# Patient Record
Sex: Female | Born: 1995
Health system: Southern US, Community
[De-identification: ages and names within clinical notes are randomized; demographics above are authoritative.]

## PROBLEM LIST (undated history)

## (undated) ENCOUNTER — Inpatient Hospital Stay (HOSPITAL_COMMUNITY): Payer: Medicaid Other

## (undated) DIAGNOSIS — R531 Weakness: Secondary | ICD-10-CM

## (undated) DIAGNOSIS — I4891 Unspecified atrial fibrillation: Secondary | ICD-10-CM

## (undated) DIAGNOSIS — R110 Nausea: Secondary | ICD-10-CM

## (undated) DIAGNOSIS — I10 Essential (primary) hypertension: Secondary | ICD-10-CM

## (undated) DIAGNOSIS — R519 Headache, unspecified: Secondary | ICD-10-CM

## (undated) DIAGNOSIS — F419 Anxiety disorder, unspecified: Secondary | ICD-10-CM

## (undated) HISTORY — DX: Nausea: R11.0

## (undated) HISTORY — DX: Anxiety disorder, unspecified: F41.9

## (undated) HISTORY — DX: Weakness: R53.1

## (undated) HISTORY — DX: Headache, unspecified: R51.9

---
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
## 2013-03-25 ENCOUNTER — Encounter (HOSPITAL_COMMUNITY): Payer: Self-pay | Admitting: Emergency Medicine

## 2013-03-25 ENCOUNTER — Emergency Department (HOSPITAL_COMMUNITY)
Admission: EM | Admit: 2013-03-25 | Discharge: 2013-03-25 | Disposition: A | Discharge status: Home / Self Care | Payer: Medicaid Other | Attending: Emergency Medicine | Admitting: Emergency Medicine

## 2013-03-25 DIAGNOSIS — M542 Cervicalgia: Secondary | ICD-10-CM | POA: Insufficient documentation

## 2013-03-25 DIAGNOSIS — Z79899 Other long term (current) drug therapy: Secondary | ICD-10-CM | POA: Insufficient documentation

## 2013-03-25 DIAGNOSIS — Z3202 Encounter for pregnancy test, result negative: Secondary | ICD-10-CM | POA: Insufficient documentation

## 2013-03-25 DIAGNOSIS — R110 Nausea: Secondary | ICD-10-CM | POA: Insufficient documentation

## 2013-03-25 DIAGNOSIS — I10 Essential (primary) hypertension: Secondary | ICD-10-CM | POA: Insufficient documentation

## 2013-03-25 DIAGNOSIS — R51 Headache: Secondary | ICD-10-CM | POA: Insufficient documentation

## 2013-03-25 LAB — URINALYSIS, ROUTINE W REFLEX MICROSCOPIC
Bilirubin Urine: NEGATIVE
Hgb urine dipstick: NEGATIVE
Specific Gravity, Urine: 1.03 (ref 1.005–1.030)
pH: 5.5 (ref 5.0–8.0)

## 2013-03-25 MED ORDER — DIPHENHYDRAMINE HCL 50 MG/ML IJ SOLN
25.0000 mg | Freq: Once | INTRAMUSCULAR | Status: AC
  Administered 2013-03-25: 25 mg via INTRAVENOUS
  Filled 2013-03-25: qty 1

## 2013-03-25 MED ORDER — METOCLOPRAMIDE HCL 5 MG/ML IJ SOLN
10.0000 mg | Freq: Once | INTRAMUSCULAR | Status: AC
  Administered 2013-03-25: 10 mg via INTRAVENOUS
  Filled 2013-03-25: qty 2

## 2013-03-25 MED ORDER — HYDROMORPHONE HCL PF 1 MG/ML IJ SOLN
1.0000 mg | Freq: Once | INTRAMUSCULAR | Status: AC
  Administered 2013-03-25: 1 mg via INTRAVENOUS
  Filled 2013-03-25: qty 1

## 2013-03-25 MED ORDER — SODIUM CHLORIDE 0.9 % IV BOLUS (SEPSIS)
1000.0000 mL | Freq: Once | INTRAVENOUS | Status: AC
  Administered 2013-03-25: 1000 mL via INTRAVENOUS

## 2013-03-25 NOTE — ED Provider Notes (Signed)
  Medical screening examination/treatment/procedure(s) were performed by non-physician practitioner and as supervising physician I was immediately available for consultation/collaboration.    Gerhard Munch, MD 03/25/13 2358

## 2013-03-25 NOTE — ED Notes (Signed)
Pt c/o headache x 4 days w/ sensitivity to light and sound.  States she feels nauseated but has not vomited.  States today she felt SOB.  NAD.

## 2013-03-25 NOTE — ED Provider Notes (Signed)
CSN: [id number]     Arrival date & time 03/25/13  1727 History   First MD Initiated Contact with Patient 03/25/13 1859     Chief Complaint  Patient presents with  . Headache   (Consider location/radiation/quality/duration/timing/severity/associated sxs/prior Treatment) HPI  17 year old morbidly obese female with history of hypertension, presents complaining of headache. Patient states she has recurrent headache. Would have headache just about a few times a week. The past 4 days she has had persistent headache. Headache is described as a throbbing sensation to her right forehead, nonradiating, worsening with light and sound. Endorse nausea without vomiting. Headache usually improves with taking Advil however it has not helped this time. She endorses neck discomfort low back discomfort as well for the past 4 days. She denies any fever, chills, change in vision, runny nose, sneezing, cough, chest pain, shortness of breath associated endorse some mild respiratory. No abdominal pain, back pain, dysuria, or rash. Does not recall her last menstrual period since patient is on Depo.  This is her usual headaches, just worse. She cannot recall anything that precipitated this symptom.    Past Medical History  Diagnosis Date  . Hypertension   . Nausea   . Weakness    Past Surgical History  Procedure Laterality Date  . Ankle surgery  04/2005  . Nasal sinus surgery  08/2010  . Laparoscopic appendectomy  09/16/2011    Procedure: APPENDECTOMY LAPAROSCOPIC;  Surgeon: Velora Heckler, MD;  Location: [facility];  Service: General;  Laterality: N/A;  . Appendectomy  09/16/11   Family History  Problem Relation Age of Onset  . Cancer Paternal Grandmother     ovarian   History  Substance Use Topics  . Smoking status: Never Smoker   . Smokeless tobacco: Never Used  . Alcohol Use: No   OB History   Grav Para Term Preterm Abortions TAB SAB Ect Mult Living                 Review of Systems  All other systems  reviewed and are negative.    Allergies  Review of patient's allergies indicates no known allergies.  Home Medications   Current Outpatient Rx  Name  Route  Sig  Dispense  Refill  . albuterol (PROVENTIL HFA;VENTOLIN HFA) 108 (90 BASE) MCG/ACT inhaler   Inhalation   Inhale 2 puffs into the lungs every 6 (six) hours as needed. For shortness of breath         . hydrochlorothiazide 25 MG tablet   Oral   Take 25 mg by mouth daily.           Marland Kitchen ibuprofen (ADVIL,MOTRIN) 200 MG tablet   Oral   Take 200 mg by mouth every 6 (six) hours as needed for pain.         . medroxyPROGESTERone (DEPO-PROVERA) 150 MG/ML injection   Intramuscular   Inject 150 mg into the muscle every 3 (three) months.            BP 143/91  Pulse 113  Temp(Src) 98.9 F (37.2 C) (Oral)  Resp 16  SpO2 100% Physical Exam  Nursing note and vitals reviewed. Constitutional: She appears well-developed and well-nourished. No distress.  Awake, alert, nontoxic appearance  HENT:  Head: Atraumatic.  Right Ear: External ear normal.  Left Ear: External ear normal.  Mouth/Throat: Oropharynx is clear and moist.  Eyes: Conjunctivae and EOM are normal. Pupils are equal, round, and reactive to light. Right eye exhibits no discharge. Left eye exhibits  no discharge.  Neck: Neck supple.  No nuchal rigidity  Cardiovascular: Normal rate and regular rhythm.   Pulmonary/Chest: Effort normal. No respiratory distress. She exhibits no tenderness.  Abdominal: Soft. There is no tenderness. There is no rebound.  Genitourinary:  No CVA tenderness  Musculoskeletal: She exhibits no tenderness.  ROM appears intact, no obvious focal weakness  Neurological:  Mental status and motor strength appears intact  Skin: No rash noted.  Psychiatric: She has a normal mood and affect.    ED Course  Procedures (including critical care time)  Headache similar to previous, no fever, neck stiffness, neuro findings or new symptoms to  suggest more serious etiology.  I don't think SAH, ICH, meningitis, encephalitis, mass at this time.  No recent trauma.  I don't feel imaging necessary at this time.  Plan to control symptoms.  Since pt endorse lower back pain, will check UA to r/o UTI.  check pregnancy test   9:50 PM Patient reports she feels much better and related to be discharged. Patient is stable for discharge. Recommend follow up with her primary care Dr. for further care. Return precautions discussed.  Labs Review Labs Reviewed  URINALYSIS, ROUTINE W REFLEX MICROSCOPIC - Abnormal; Notable for the following:    APPearance CLOUDY (*)    All other components within normal limits  POCT PREGNANCY, URINE   Imaging Review No results found.  MDM   1. Headache in front of head    BP 133/80  Pulse 105  Temp(Src) 98.9 F (37.2 C) (Oral)  Resp 20  SpO2 100%     Fayrene Helper, PA-C 03/25/13 2153

## 2013-04-26 ENCOUNTER — Encounter (HOSPITAL_COMMUNITY): Payer: Self-pay | Admitting: Emergency Medicine

## 2013-04-26 ENCOUNTER — Emergency Department (HOSPITAL_COMMUNITY)
Admission: EM | Admit: 2013-04-26 | Discharge: 2013-04-27 | Disposition: A | Discharge status: Home / Self Care | Payer: Medicaid Other | Attending: Emergency Medicine | Admitting: Emergency Medicine

## 2013-04-26 ENCOUNTER — Emergency Department (HOSPITAL_COMMUNITY): Payer: Medicaid Other

## 2013-04-26 DIAGNOSIS — Z79899 Other long term (current) drug therapy: Secondary | ICD-10-CM | POA: Insufficient documentation

## 2013-04-26 DIAGNOSIS — R000 Tachycardia, unspecified: Secondary | ICD-10-CM | POA: Insufficient documentation

## 2013-04-26 DIAGNOSIS — I10 Essential (primary) hypertension: Secondary | ICD-10-CM | POA: Insufficient documentation

## 2013-04-26 DIAGNOSIS — H9209 Otalgia, unspecified ear: Secondary | ICD-10-CM | POA: Insufficient documentation

## 2013-04-26 DIAGNOSIS — J069 Acute upper respiratory infection, unspecified: Secondary | ICD-10-CM | POA: Insufficient documentation

## 2013-04-26 DIAGNOSIS — Z3202 Encounter for pregnancy test, result negative: Secondary | ICD-10-CM | POA: Insufficient documentation

## 2013-04-26 DIAGNOSIS — R509 Fever, unspecified: Secondary | ICD-10-CM | POA: Insufficient documentation

## 2013-04-26 DIAGNOSIS — R51 Headache: Secondary | ICD-10-CM | POA: Insufficient documentation

## 2013-04-26 LAB — BASIC METABOLIC PANEL
BUN: 8 mg/dL (ref 6–23)
CO2: 22 mEq/L (ref 19–32)
Calcium: 9.4 mg/dL (ref 8.4–10.5)
Chloride: 101 mEq/L (ref 96–112)
GFR calc Af Amer: >90 mL/min (ref >90)
GFR calc non Af Amer: >90 mL/min (ref >90)
Glucose, Bld: 97 mg/dL (ref 70–99)
Potassium: 3.5 mEq/L (ref 3.5–5.1)

## 2013-04-26 LAB — URINALYSIS, ROUTINE W REFLEX MICROSCOPIC
Bilirubin Urine: NEGATIVE
Protein, ur: NEGATIVE mg/dL
Urobilinogen, UA: 0.2 mg/dL (ref 0.0–1.0)

## 2013-04-26 LAB — CBC WITH DIFFERENTIAL/PLATELET
Eosinophils Relative: 2 % (ref 0–5)
HCT: 44.4 % (ref 36.0–46.0)
Lymphocytes Relative: 40 % (ref 12–46)
Lymphs Abs: 5.9 10*3/uL — ABNORMAL HIGH (ref 0.7–4.0)
MCV: 85.1 fL (ref 78.0–100.0)
Monocytes Relative: 7 % (ref 3–12)
Neutro Abs: 7.4 10*3/uL (ref 1.7–7.7)
Platelets: 275 10*3/uL (ref 150–400)
RBC: 5.22 MIL/uL — ABNORMAL HIGH (ref 3.87–5.11)
WBC: 14.7 10*3/uL — ABNORMAL HIGH (ref 4.0–10.5)

## 2013-04-26 LAB — URINE MICROSCOPIC-ADD ON

## 2013-04-26 MED ORDER — IBUPROFEN 800 MG PO TABS
800.0000 mg | ORAL_TABLET | Freq: Once | ORAL | Status: AC
  Administered 2013-04-27: 800 mg via ORAL
  Filled 2013-04-26: qty 1

## 2013-04-26 MED ORDER — SODIUM CHLORIDE 0.9 % IV BOLUS (SEPSIS)
1000.0000 mL | Freq: Once | INTRAVENOUS | Status: AC
  Administered 2013-04-26: 1000 mL via INTRAVENOUS

## 2013-04-26 NOTE — ED Provider Notes (Addendum)
CSN: [id number]     Arrival date & time 04/26/13  2134 History   First MD Initiated Contact with Patient 04/26/13 2157     Chief Complaint  Patient presents with  . Sore Throat  . Cough  . Nasal Congestion   (Consider location/radiation/quality/duration/timing/severity/associated sxs/prior Treatment) Patient is a 17 y.o. female presenting with URI.  URI Presenting symptoms: congestion, cough, ear pain, facial pain, fever (intermittent, last a few days ago) and sore throat   Severity:  Moderate Onset quality:  Sudden Duration:  10 days Timing:  Constant Progression:  Worsening Chronicity:  New Relieved by:  Nothing Worsened by:  Nothing tried Ineffective treatments: tylenol cold and flu. Associated symptoms comment:  Shortness of breath   Past Medical History  Diagnosis Date  . Hypertension   . Nausea   . Weakness    Past Surgical History  Procedure Laterality Date  . Ankle surgery  04/2005  . Nasal sinus surgery  08/2010  . Laparoscopic appendectomy  09/16/2011    Procedure: APPENDECTOMY LAPAROSCOPIC;  Surgeon: Velora Heckler, MD;  Location: [facility];  Service: General;  Laterality: N/A;  . Appendectomy  09/16/11   Family History  Problem Relation Age of Onset  . Cancer Paternal Grandmother     ovarian   History  Substance Use Topics  . Smoking status: Never Smoker   . Smokeless tobacco: Never Used  . Alcohol Use: No   OB History   Grav Para Term Preterm Abortions TAB SAB Ect Mult Living                 Review of Systems  Constitutional: Positive for fever (intermittent, last a few days ago).  HENT: Positive for congestion, ear pain and sore throat.   Respiratory: Positive for cough and shortness of breath.   Cardiovascular: Negative for chest pain.  Gastrointestinal: Negative for nausea, vomiting, abdominal pain and diarrhea.  All other systems reviewed and are negative.    Allergies  Review of patient's allergies indicates no known allergies.  Home  Medications   Current Outpatient Rx  Name  Route  Sig  Dispense  Refill  . dextromethorphan (DELSYM) 30 MG/5ML liquid   Oral   Take 60 mg by mouth 2 (two) times daily as needed for cough.         . hydrochlorothiazide 25 MG tablet   Oral   Take 25 mg by mouth daily.           . medroxyPROGESTERone (DEPO-PROVERA) 150 MG/ML injection   Intramuscular   Inject 150 mg into the muscle every 3 (three) months.           . nortriptyline (PAMELOR) 25 MG capsule   Oral   Take 25 mg by mouth at bedtime.         . Pseudoeph-CPM-DM-APAP (TYLENOL COLD PO)   Oral   Take 1 tablet by mouth 3 (three) times daily as needed (cold symptoms).          BP 159/99  Pulse 132  Temp(Src) 97.9 F (36.6 C) (Oral)  Resp 20  SpO2 100% Physical Exam  Nursing note and vitals reviewed. Constitutional: She is oriented to person, place, and time. She appears well-developed and well-nourished. No distress.  HENT:  Head: Normocephalic and atraumatic.  Mouth/Throat: Oropharynx is clear and moist. No tonsillar abscesses.  Evidence of post nasal drip  Eyes: Conjunctivae are normal. Pupils are equal, round, and reactive to light. No scleral icterus.  Neck: Neck supple.  Cardiovascular: Regular rhythm, normal heart sounds and intact distal pulses.  Tachycardia present.   No murmur heard. Pulmonary/Chest: Effort normal and breath sounds normal. No stridor. No respiratory distress. She has no rales.  Abdominal: Soft. Bowel sounds are normal. She exhibits no distension. There is no tenderness.  Musculoskeletal: Normal range of motion.  Neurological: She is alert and oriented to person, place, and time.  Skin: Skin is warm and dry. No rash noted.  Psychiatric: She has a normal mood and affect. Her behavior is normal.    ED Course  Procedures (including critical care time) Labs Review Labs Reviewed  CBC WITH DIFFERENTIAL - Abnormal; Notable for the following:    WBC 14.7 (*)    RBC 5.22 (*)     Hemoglobin 15.4 (*)    Lymphs Abs 5.9 (*)    All other components within normal limits  URINALYSIS, ROUTINE W REFLEX MICROSCOPIC - Abnormal; Notable for the following:    APPearance CLOUDY (*)    Hgb urine dipstick LARGE (*)    All other components within normal limits  URINE MICROSCOPIC-ADD ON - Abnormal; Notable for the following:    Squamous Epithelial / LPF MANY (*)    Bacteria, UA FEW (*)    All other components within normal limits  BASIC METABOLIC PANEL  D-DIMER, QUANTITATIVE  POCT PREGNANCY, URINE   Imaging Review Dg Chest 2 View  04/26/2013   CLINICAL DATA:  Cough.  EXAM: CHEST  2 VIEW  COMPARISON:  CHEST x-ray 04/27/2010  FINDINGS: Lung volumes are normal. No consolidative airspace disease. No pleural effusions. No pneumothorax. No pulmonary nodule or mass noted. Pulmonary vasculature and the cardiomediastinal silhouette are within normal limits.  IMPRESSION: 1.  No radiographic evidence of acute cardiopulmonary disease.   Electronically Signed   By: Trudie Reed M.D.   On: 04/26/2013 22:43  All radiology studies independently viewed by me.     EKG Interpretation     Ventricular Rate:  120 PR Interval:  153 QRS Duration: 81 QT Interval:  347 QTC Calculation: 490 R Axis:   77 Text Interpretation:  Sinus tachycardia Borderline prolonged QT interval Baseline wander in lead(s) III compared to prior, heart rate has increased.            MDM   1. URI (upper respiratory infection)    17 yo female with 1.5 weeks of worsening sore throat and URI symptoms.  Tachycardic on arrival, but in no distress.  Complained of cough and shortness of breath.  Labs show leukocytosis but otherwise unremarkable.  Given 1L NS without significant improvement in tachycardia.  Therefore, d dimer added.  I suspect a persistent sinusitis and she has close PCP follow up.  Given prolonged course, plan treatment with Augmentin if D Dimer negative.  12:32 AM D Dimer negative. Tachycardia has  improved to 120's on my exam.  She remains well appearing and non toxic.  I think she is appropriate for outpatient treatment.  Given return precautions.    Candyce Churn, MD 04/27/13 [phone]

## 2013-04-26 NOTE — ED Notes (Signed)
Sore throat, cough productive with green sputum, has been to PMD, strep neg, pt not eating and drinking well at present, mother concerned about dehydration

## 2013-04-27 LAB — D-DIMER, QUANTITATIVE: D-Dimer, Quant: <0.27 ug/mL-FEU (ref 0.00–0.48)

## 2013-04-27 MED ORDER — SODIUM CHLORIDE 0.9 % IV BOLUS (SEPSIS)
1000.0000 mL | Freq: Once | INTRAVENOUS | Status: DC
  Administered 2013-04-27: 1000 mL via INTRAVENOUS

## 2013-04-27 MED ORDER — AMOXICILLIN-POT CLAVULANATE 875-125 MG PO TABS: 1.0000 | ORAL_TABLET | Freq: Two times a day (BID) | ORAL | Status: DC

## 2013-04-28 ENCOUNTER — Emergency Department (HOSPITAL_COMMUNITY)
Admission: EM | Admit: 2013-04-28 | Discharge: 2013-04-28 | Disposition: A | Discharge status: Home / Self Care | Payer: Medicaid Other | Attending: Emergency Medicine | Admitting: Emergency Medicine

## 2013-04-28 ENCOUNTER — Emergency Department (HOSPITAL_COMMUNITY): Payer: Medicaid Other

## 2013-04-28 ENCOUNTER — Encounter (HOSPITAL_COMMUNITY): Payer: Self-pay | Admitting: Emergency Medicine

## 2013-04-28 DIAGNOSIS — R110 Nausea: Secondary | ICD-10-CM | POA: Insufficient documentation

## 2013-04-28 DIAGNOSIS — R079 Chest pain, unspecified: Secondary | ICD-10-CM | POA: Insufficient documentation

## 2013-04-28 DIAGNOSIS — I10 Essential (primary) hypertension: Secondary | ICD-10-CM | POA: Insufficient documentation

## 2013-04-28 DIAGNOSIS — Z79899 Other long term (current) drug therapy: Secondary | ICD-10-CM | POA: Insufficient documentation

## 2013-04-28 DIAGNOSIS — R000 Tachycardia, unspecified: Secondary | ICD-10-CM | POA: Insufficient documentation

## 2013-04-28 DIAGNOSIS — R059 Cough, unspecified: Secondary | ICD-10-CM | POA: Insufficient documentation

## 2013-04-28 DIAGNOSIS — R05 Cough: Secondary | ICD-10-CM | POA: Insufficient documentation

## 2013-04-28 LAB — COMPREHENSIVE METABOLIC PANEL
Albumin: 3.7 g/dL (ref 3.5–5.2)
Alkaline Phosphatase: 95 U/L (ref 39–117)
BUN: 12 mg/dL (ref 6–23)
Calcium: 9.9 mg/dL (ref 8.4–10.5)
Chloride: 101 mEq/L (ref 96–112)
Creatinine, Ser: 0.77 mg/dL (ref 0.50–1.10)
GFR calc Af Amer: >90 mL/min (ref >90)
Glucose, Bld: 106 mg/dL — ABNORMAL HIGH (ref 70–99)
Potassium: 3.8 mEq/L (ref 3.5–5.1)
Total Bilirubin: 0.3 mg/dL (ref 0.3–1.2)
Total Protein: 7.5 g/dL (ref 6.0–8.3)

## 2013-04-28 LAB — CBC WITH DIFFERENTIAL/PLATELET
Basophils Relative: 1 % (ref 0–1)
Eosinophils Absolute: 0.2 10*3/uL (ref 0.0–0.7)
HCT: 43.5 % (ref 36.0–46.0)
Hemoglobin: 14.8 g/dL (ref 12.0–15.0)
Lymphocytes Relative: 42 % (ref 12–46)
Lymphs Abs: 4.8 10*3/uL — ABNORMAL HIGH (ref 0.7–4.0)
MCH: 28.8 pg (ref 26.0–34.0)
MCHC: 34 g/dL (ref 30.0–36.0)
MCV: 84.6 fL (ref 78.0–100.0)
Neutro Abs: 5.6 10*3/uL (ref 1.7–7.7)
RBC: 5.14 MIL/uL — ABNORMAL HIGH (ref 3.87–5.11)

## 2013-04-28 LAB — D-DIMER, QUANTITATIVE: D-Dimer, Quant: <0.27 ug/mL-FEU (ref 0.00–0.48)

## 2013-04-28 MED ORDER — PREDNISONE 10 MG PO TABS: 40.0000 mg | ORAL_TABLET | Freq: Every day | ORAL | Status: AC

## 2013-04-28 MED ORDER — PREDNISONE 20 MG PO TABS
60.0000 mg | ORAL_TABLET | ORAL | Status: AC
  Administered 2013-04-28: 60 mg via ORAL
  Filled 2013-04-28: qty 3

## 2013-04-28 MED ORDER — SODIUM CHLORIDE 0.9 % IV BOLUS (SEPSIS)
1000.0000 mL | Freq: Once | INTRAVENOUS | Status: AC
  Administered 2013-04-28: 1000 mL via INTRAVENOUS

## 2013-04-28 MED ORDER — KETOROLAC TROMETHAMINE 30 MG/ML IJ SOLN
30.0000 mg | Freq: Once | INTRAMUSCULAR | Status: AC
  Administered 2013-04-28: 30 mg via INTRAVENOUS
  Filled 2013-04-28: qty 1

## 2013-04-28 NOTE — ED Provider Notes (Signed)
CSN: [id number]     Arrival date & time 04/28/13  2102 History   First MD Initiated Contact with Patient 04/28/13 2131     Chief Complaint  Patient presents with  . Chest Pain   (Consider location/radiation/quality/duration/timing/severity/associated sxs/prior Treatment) HPI Patient presents with new chest pain.  Patient has recently diagnosis of URI, 2 days ago, and has had URI like symptoms for approximately one week. She states that in the interval 2 days, since evaluation here, she has had less cough, less fever, and felt generally better.  However, approximately 12 hours ago the patient developed pain in the sternum and left sternal area.  The pain is sharp, nonradiating, nonexertional, nonpleuritic. There is mild associated nausea, but no syncope, no vomiting, no diarrhea. No relief with anything.  Past Medical History  Diagnosis Date  . Hypertension   . Nausea   . Weakness    Past Surgical History  Procedure Laterality Date  . Ankle surgery  04/2005  . Nasal sinus surgery  08/2010  . Laparoscopic appendectomy  09/16/2011    Procedure: APPENDECTOMY LAPAROSCOPIC;  Surgeon: Velora Heckler, MD;  Location: [facility];  Service: General;  Laterality: N/A;  . Appendectomy  09/16/11   Family History  Problem Relation Age of Onset  . Cancer Paternal Grandmother     ovarian   History  Substance Use Topics  . Smoking status: Never Smoker   . Smokeless tobacco: Never Used  . Alcohol Use: No   OB History   Grav Para Term Preterm Abortions TAB SAB Ect Mult Living                 Review of Systems  Constitutional:       Per HPI, otherwise negative  HENT:       Per HPI, otherwise negative  Respiratory:       Per HPI, otherwise negative  Cardiovascular:       Per HPI, otherwise negative  Gastrointestinal: Negative for vomiting.  Endocrine:       Negative aside from HPI  Genitourinary:       Neg aside from HPI   Musculoskeletal:       Per HPI, otherwise negative  Skin:  Negative.   Neurological: Negative for syncope.    Allergies  Review of patient's allergies indicates no known allergies.  Home Medications   Current Outpatient Rx  Name  Route  Sig  Dispense  Refill  . amoxicillin-clavulanate (AUGMENTIN) 875-125 MG per tablet   Oral   Take 1 tablet by mouth 2 (two) times daily.   14 tablet   0   . dextromethorphan (DELSYM) 30 MG/5ML liquid   Oral   Take 60 mg by mouth 2 (two) times daily as needed for cough.         . hydrochlorothiazide 25 MG tablet   Oral   Take 25 mg by mouth daily.           . medroxyPROGESTERone (DEPO-PROVERA) 150 MG/ML injection   Intramuscular   Inject 150 mg into the muscle every 3 (three) months.           . nortriptyline (PAMELOR) 25 MG capsule   Oral   Take 25 mg by mouth at bedtime.         . Pseudoeph-CPM-DM-APAP (TYLENOL COLD PO)   Oral   Take 1 tablet by mouth 3 (three) times daily as needed (cold symptoms).          BP 149/85  Pulse 138  Temp(Src) 98.1 F (36.7 C) (Oral)  Resp 22  Ht 5\' 7"  (1.702 m)  Wt 252 lb 6.4 oz (114.488 kg)  BMI 39.52 kg/m2  SpO2 99% Physical Exam  Nursing note and vitals reviewed. Constitutional: She is oriented to person, place, and time. She appears well-developed and well-nourished. No distress.  HENT:  Head: Normocephalic and atraumatic.  Eyes: Conjunctivae and EOM are normal.  Cardiovascular: Regular rhythm.  Tachycardia present.   Pulmonary/Chest: Effort normal and breath sounds normal. No stridor. No respiratory distress.    Abdominal: She exhibits no distension.  Musculoskeletal: She exhibits no edema.  Neurological: She is alert and oriented to person, place, and time. No cranial nerve deficit.  Skin: Skin is warm and dry.  Psychiatric: She has a normal mood and affect.    ED Course  Procedures (including critical care time) Labs Review Labs Reviewed  CBC WITH DIFFERENTIAL  COMPREHENSIVE METABOLIC PANEL  D-DIMER, QUANTITATIVE  LIPASE,  BLOOD  URINALYSIS, ROUTINE W REFLEX MICROSCOPIC   Imaging Review Dg Chest 2 View  04/26/2013   CLINICAL DATA:  Cough.  EXAM: CHEST  2 VIEW  COMPARISON:  CHEST x-ray 04/27/2010  FINDINGS: Lung volumes are normal. No consolidative airspace disease. No pleural effusions. No pneumothorax. No pulmonary nodule or mass noted. Pulmonary vasculature and the cardiomediastinal silhouette are within normal limits.  IMPRESSION: 1.  No radiographic evidence of acute cardiopulmonary disease.   Electronically Signed   By: Trudie Reed M.D.   On: 04/26/2013 22:43    EKG Interpretation     Ventricular Rate:  138 PR Interval:  128 QRS Duration: 79 QT Interval:  290 QTC Calculation: 439 R Axis:   79 Text Interpretation:  Sinus tachycardia Borderline ST elevation, inferior leads No significant change since last tracing Abnormal ekg            Cardiac 120 tachycardia abnormal Pulse oximetry 97% room air normal After the initial evaluation her chart, discussed findings from 2 days ago with her and her family members.   11:08 PM Heart rate 110 We had a lengthy discussion with the patient's mother and boyfriend about possible causes for her ongoing cough, chest pain.  With her decreased heart rate, improved condition, she will be discharged to follow up with family practice, and cardiology. MDM  No diagnosis found. This patient presents now with chest pain, after several days of cough, generalized discomfort, recent diagnosis of URI.  On exam she is awake and alert, afebrile.  She does have mild cough, but she also has tenderness to palpation about the left sternal costal margin.  Labs, cxr reassuring.  With little suspicion for MI, pneumothorax, pneumonia, and there is some suspicion for costochondritis versus pericarditis versus bronchitis.  Absent distress, and her improvement, she is appropriate for outpatient trial of anti-inflammatories.  Patient states that she has a followup availability,  understands return precautions, and she was d/c in stable condition.     Gerhard Munch, MD 04/28/13 2312

## 2013-04-28 NOTE — ED Notes (Signed)
Pt states today about 0830 she started having chest pain beneath her left breast that radiates around her side to her back  Pt states the pain has been constant all day but has progressively been getting worse  Pt states the pain makes it hard to breathe  Pt denies any other sxs

## 2013-05-15 ENCOUNTER — Ambulatory Visit: Payer: Medicaid Other | Admitting: Interventional Cardiology

## 2018-04-30 ENCOUNTER — Inpatient Hospital Stay (HOSPITAL_COMMUNITY)
Admission: AD | Admit: 2018-04-30 | Discharge: 2018-04-30 | Disposition: A | Discharge status: Home / Self Care | Payer: BLUE CROSS/BLUE SHIELD | Source: Ambulatory Visit | Attending: Emergency Medicine | Admitting: Emergency Medicine

## 2018-04-30 ENCOUNTER — Encounter (HOSPITAL_COMMUNITY): Payer: Self-pay

## 2018-04-30 DIAGNOSIS — O26891 Other specified pregnancy related conditions, first trimester: Secondary | ICD-10-CM | POA: Diagnosis not present

## 2018-04-30 DIAGNOSIS — Y92002 Bathroom of unspecified non-institutional (private) residence single-family (private) house as the place of occurrence of the external cause: Secondary | ICD-10-CM | POA: Diagnosis not present

## 2018-04-30 DIAGNOSIS — Y93E1 Activity, personal bathing and showering: Secondary | ICD-10-CM | POA: Diagnosis not present

## 2018-04-30 DIAGNOSIS — Z79899 Other long term (current) drug therapy: Secondary | ICD-10-CM | POA: Insufficient documentation

## 2018-04-30 DIAGNOSIS — W182XXA Fall in (into) shower or empty bathtub, initial encounter: Secondary | ICD-10-CM | POA: Insufficient documentation

## 2018-04-30 DIAGNOSIS — S060X0A Concussion without loss of consciousness, initial encounter: Secondary | ICD-10-CM | POA: Diagnosis not present

## 2018-04-30 DIAGNOSIS — S0990XA Unspecified injury of head, initial encounter: Secondary | ICD-10-CM | POA: Diagnosis not present

## 2018-04-30 DIAGNOSIS — O10011 Pre-existing essential hypertension complicating pregnancy, first trimester: Secondary | ICD-10-CM | POA: Insufficient documentation

## 2018-04-30 DIAGNOSIS — Y999 Unspecified external cause status: Secondary | ICD-10-CM | POA: Insufficient documentation

## 2018-04-30 DIAGNOSIS — W19XXXA Unspecified fall, initial encounter: Secondary | ICD-10-CM

## 2018-04-30 DIAGNOSIS — Z3A01 Less than 8 weeks gestation of pregnancy: Secondary | ICD-10-CM | POA: Diagnosis not present

## 2018-04-30 DIAGNOSIS — O9A211 Injury, poisoning and certain other consequences of external causes complicating pregnancy, first trimester: Secondary | ICD-10-CM | POA: Insufficient documentation

## 2018-04-30 LAB — URINALYSIS, ROUTINE W REFLEX MICROSCOPIC
Bilirubin Urine: NEGATIVE
Glucose, UA: NEGATIVE mg/dL
Hgb urine dipstick: NEGATIVE
KETONES UR: NEGATIVE mg/dL
LEUKOCYTES UA: NEGATIVE
Nitrite: NEGATIVE
PH: 7 (ref 5.0–8.0)
Protein, ur: NEGATIVE mg/dL
SPECIFIC GRAVITY, URINE: 1.012 (ref 1.005–1.030)

## 2018-04-30 LAB — POCT PREGNANCY, URINE: Preg Test, Ur: POSITIVE — AB

## 2018-04-30 MED ORDER — ACETAMINOPHEN 325 MG PO TABS
650.0000 mg | ORAL_TABLET | Freq: Once | ORAL | Status: AC
  Administered 2018-04-30: 650 mg via ORAL
  Filled 2018-04-30: qty 2

## 2018-04-30 MED ORDER — SODIUM CHLORIDE 0.9 % IV BOLUS
1000.0000 mL | Freq: Once | INTRAVENOUS | Status: AC
  Administered 2018-04-30: 1000 mL via INTRAVENOUS

## 2018-04-30 MED ORDER — ONDANSETRON HCL 4 MG/2ML IJ SOLN
4.0000 mg | Freq: Once | INTRAMUSCULAR | Status: AC
  Administered 2018-04-30: 4 mg via INTRAVENOUS
  Filled 2018-04-30: qty 2

## 2018-04-30 NOTE — ED Notes (Signed)
PATIENT IS RESTING WITH CALL BELL IN REACH AND FAMILY AT BEDSIDE 

## 2018-04-30 NOTE — ED Notes (Signed)
Patient able to ambulate independently  

## 2018-04-30 NOTE — ED Notes (Signed)
Took patient saline lock out patient is getting dressed °

## 2018-04-30 NOTE — ED Notes (Signed)
[facility] in [facility] this am, c/o headache, alert and oriented, no distress noted

## 2018-04-30 NOTE — ED Provider Notes (Signed)
[facility] EMERGENCY DEPARTMENT Provider Note   CSN: [id number] Arrival date & time: 04/30/18  0844     History   Chief Complaint Chief Complaint  Patient presents with  . Head Injury  . Fall    HPI Eileen Buck is a 22 y.o. female.  The history is provided by the patient. No language interpreter was used.  Head Injury    Fall      22 year old female who is [redacted] weeks pregnant, sent here from [facility] for evaluation of a recent fall.  Patient states she was in the shower today, she reached down to pick up an object, lost balance, fell forward and struck her forehead against the ground.  She denies any loss of consciousness but does report moderate pain to her forehead.  She also complaining of dizziness afterward and feeling nauseous.  She denies loss of consciousness, denies any significant neck pain chest pain or pain anywhere else.  She denies any focal numbness or weakness or confusion.  [facility] to come to the ER for further evaluation.  She denies any specific treatment tried at this time.  She mentioned her pregnancy has been going fine, she denies any abdominal pain, vaginal bleeding or vaginal discharge.  No precipitating symptoms prior to the fall.  Past Medical History:  Diagnosis Date  . Hypertension   . Nausea   . Weakness     Patient Active Problem List   Diagnosis Date Noted  . Acute appendicitis 09/16/2011    Past Surgical History:  Procedure Laterality Date  . ANKLE SURGERY  04/2005  . APPENDECTOMY  09/16/11  . LAPAROSCOPIC APPENDECTOMY  09/16/2011   Procedure: APPENDECTOMY LAPAROSCOPIC;  Surgeon: Velora Heckler, MD;  Location: [facility];  Service: General;  Laterality: N/A;  . NASAL SINUS SURGERY  08/2010     OB History    Gravida  1   Para      Term      Preterm      AB      Living        SAB      TAB      Ectopic      Multiple      Live Births               Home  Medications    Prior to Admission medications   Medication Sig Start Date End Date Taking? Authorizing Provider  amoxicillin-clavulanate (AUGMENTIN) 875-125 MG per tablet Take 1 tablet by mouth 2 (two) times daily. 04/27/13   Blake Divine, MD  dextromethorphan (DELSYM) 30 MG/5ML liquid Take 60 mg by mouth 2 (two) times daily as needed for cough.    [provider]  hydrochlorothiazide 25 MG tablet Take 25 mg by mouth daily.      [provider]  medroxyPROGESTERone (DEPO-PROVERA) 150 MG/ML injection Inject 150 mg into the muscle every 3 (three) months.      [provider]  nortriptyline (PAMELOR) 25 MG capsule Take 25 mg by mouth at bedtime.    [provider]  Pseudoeph-CPM-DM-APAP (TYLENOL COLD PO) Take 1 tablet by mouth 3 (three) times daily as needed (cold symptoms).    [provider]    Family History Family History  Problem Relation Age of Onset  . Cancer Paternal Grandmother        ovarian    Social History Social History   Tobacco Use  . Smoking  status: Never Smoker  . Smokeless tobacco: Never Used  Substance Use Topics  . Alcohol use: No  . Drug use: No     Allergies   Patient has no known allergies.   Review of Systems Review of Systems  All other systems reviewed and are negative.    Physical Exam Updated Vital Signs BP (!) 145/91 (BP Location: Right Arm)   Pulse (!) 102   Temp 98.7 F (37.1 C) (Oral)   Resp 20   Ht 5\' 7"  (1.702 m)   Wt 121.1 kg   SpO2 100%   BMI 41.82 kg/m   Physical Exam  Constitutional: She is oriented to person, place, and time. She appears well-developed and well-nourished. No distress.  Obese female resting comfortably in bed in no acute discomfort.  HENT:  Head: Normocephalic and atraumatic.  Tenderness to left forehead on palpation without any overlying skin changes, swelling, bruising, crepitus, or deformity.  No hemotympanum, no septal hematoma, no malocclusion, no midface  tenderness, no raccoon's eyes, no battle sign.  Eyes: Pupils are equal, round, and reactive to light. Conjunctivae and EOM are normal.  Neck: Normal range of motion. Neck supple.  No midline cervical spine tenderness crepitus or step-off.  Cardiovascular: Normal rate and regular rhythm.  Pulmonary/Chest: Effort normal and breath sounds normal.  Abdominal: Soft. There is no tenderness.  No tenderness to the palpation of the abdomen.  Neurological: She is alert and oriented to person, place, and time. She has normal strength. No cranial nerve deficit or sensory deficit. She displays a negative Romberg sign. GCS eye subscore is 4. GCS verbal subscore is 5. GCS motor subscore is 6.  Skin: No rash noted.  Psychiatric: She has a normal mood and affect.  Nursing note and vitals reviewed.    ED Treatments / Results  Labs (all labs ordered are listed, but only abnormal results are displayed) Labs Reviewed  POCT PREGNANCY, URINE - Abnormal; Notable for the following components:      Result Value   Preg Test, Ur POSITIVE (*)    All other components within normal limits  URINALYSIS, ROUTINE W REFLEX MICROSCOPIC    EKG None  Radiology No results found.  Procedures Procedures (including critical care time)  Medications Ordered in ED Medications - No data to display   Initial Impression / Assessment and Plan / ED Course  I have reviewed the triage vital signs and the nursing notes.  Pertinent labs & imaging results that were available during my care of the patient were reviewed by me and considered in my medical decision making (see chart for details).     BP (!) 145/91 (BP Location: Right Arm)   Pulse (!) 102   Temp 98.7 F (37.1 C) (Oral)   Resp 20   Ht 5\' 7"  (1.702 m)   Wt 121.1 kg   SpO2 100%   BMI 41.82 kg/m    Final Clinical Impressions(s) / ED Diagnoses   Final diagnoses:  Fall, initial encounter  Traumatic injury of head, initial encounter  [redacted] weeks gestation of  pregnancy  Concussion without loss of consciousness, initial encounter    ED Discharge Orders    None     11:13 AM Patient is [redacted] weeks pregnant who had a mechanical fall and fell earlier today while she was in the shower.  She injured her forehead and does have some concussive symptoms however she does not have any significant finding to suggest intracranial bleed, or skull fracture.  We  discussed risks and benefits of advanced imaging and decided that CT scan is probably low yield and cause more harm than benefit.  We will give IV fluid, Tylenol, Zofran and monitor closely.  12:44 PM Patient received IV fluid, Tylenol, felt get better and stable at this time.  She feels comfortable enough to go home.  Return precautions discussed.  Work note provided as requested.   Fayrene Helper, PA-C 04/30/18 1244    Long, Arlyss Repress, MD 04/30/18 [phone]

## 2018-04-30 NOTE — MAU Provider Note (Signed)
History     CSN: [id number]  Arrival date and time: 04/30/18 0844   First Provider Initiated Contact with Patient 04/30/18 [phone]      Chief Complaint  Patient presents with  . Head Injury  . Fall   Eileen Buck is a 22 y.o. G1P0 at [redacted]w[redacted]d who presents today after a fall. She states that around 0700 today she slipped in the shower, and hit her head. She did not lose consciousness at the time of the fall. She reports that she has had a constant headache 6/10 with nausea, dizziness and fatigue. She denies any vomiting.   Head Injury   The incident occurred 1 to 3 hours ago. The injury mechanism was a fall. There was no loss of consciousness. There was no blood loss. The pain is at a severity of 6/10. The pain has been constant since the injury. Associated symptoms include blurred vision and headaches. She has tried nothing for the symptoms.  Fall  Associated symptoms include headaches and nausea.    OB History    Gravida  1   Para      Term      Preterm      AB      Living        SAB      TAB      Ectopic      Multiple      Live Births              Past Medical History:  Diagnosis Date  . Hypertension   . Nausea   . Weakness     Past Surgical History:  Procedure Laterality Date  . ANKLE SURGERY  04/2005  . APPENDECTOMY  09/16/11  . LAPAROSCOPIC APPENDECTOMY  09/16/2011   Procedure: APPENDECTOMY LAPAROSCOPIC;  Surgeon: Velora Heckler, MD;  Location: [facility];  Service: General;  Laterality: N/A;  . NASAL SINUS SURGERY  08/2010    Family History  Problem Relation Age of Onset  . Cancer Paternal Grandmother        ovarian    Social History   Tobacco Use  . Smoking status: Never Smoker  . Smokeless tobacco: Never Used  Substance Use Topics  . Alcohol use: No  . Drug use: No    Allergies: No Known Allergies  Medications Prior to Admission  Medication Sig Dispense Refill Last Dose  . amoxicillin-clavulanate (AUGMENTIN) 875-125 MG per tablet Take  1 tablet by mouth 2 (two) times daily. 14 tablet 0 04/28/2013 at 1000  . dextromethorphan (DELSYM) 30 MG/5ML liquid Take 60 mg by mouth 2 (two) times daily as needed for cough.   Past Week at Unknown time  . hydrochlorothiazide 25 MG tablet Take 25 mg by mouth daily.     04/28/2013 at Unknown time  . medroxyPROGESTERone (DEPO-PROVERA) 150 MG/ML injection Inject 150 mg into the muscle every 3 (three) months.     two months ago at Unknown time  . nortriptyline (PAMELOR) 25 MG capsule Take 25 mg by mouth at bedtime.   04/28/2013 at Unknown time  . Pseudoeph-CPM-DM-APAP (TYLENOL COLD PO) Take 1 tablet by mouth 3 (three) times daily as needed (cold symptoms).   Past Week at Unknown time    Review of Systems  Constitutional: Positive for fatigue.  Eyes: Positive for blurred vision.  Gastrointestinal: Positive for nausea.  Neurological: Positive for dizziness and headaches.   Physical Exam   Blood pressure 126/85, pulse (!) 110, temperature 98.2 F (36.8 C), temperature  source Oral, resp. rate 18, height 5\' 7"  (1.702 m), weight 121.1 kg, SpO2 100 %.  Physical Exam  Nursing note and vitals reviewed. Constitutional: She is oriented to person, place, and time. She appears well-developed and well-nourished. She appears lethargic. No distress.  HENT:  Head: Normocephalic.  Cardiovascular: Normal rate.  Respiratory: Effort normal.  GI: Soft.  Neurological: She is oriented to person, place, and time. She appears lethargic.  Skin: Skin is warm and dry.  Psychiatric: She has a normal mood and affect.   Results for orders placed or performed during the hospital encounter of 04/30/18 (from the past 24 hour(s))  Pregnancy, urine POC     Status: Abnormal   Collection Time: 04/30/18  9:00 AM  Result Value Ref Range   Preg Test, Ur POSITIVE (A) NEGATIVE     MAU Course  Procedures  MDM 1610: consult with Dr. Earlene Plater, patient needs to be evaluated at [facility] 0950: Dr. Dalene Seltzer at [facility] accepts transfer   1034: carelink on the unit to transfer patient 1037: Patient left the unit with carelink to [facility].   Assessment and Plan   1. Fall, initial encounter   2. Traumatic injury of head, initial encounter   3. [redacted] weeks gestation of pregnancy    Patient transferred to [facility] via carelink for further evaluation & management      Eileen Buck 04/30/2018, 9:32 AM

## 2018-04-30 NOTE — MAU Note (Signed)
Pt slipped and fell in the shower hitting her forehead on the side of the bathtub. Currently has a small bump on her head, no redness or bruising. Pain is 6/10; felling dizzy and nauseous. No LOC.

## 2018-05-21 LAB — OB RESULTS CONSOLE HIV ANTIBODY (ROUTINE TESTING): HIV: NONREACTIVE

## 2018-05-21 LAB — OB RESULTS CONSOLE HEPATITIS B SURFACE ANTIGEN: Hepatitis B Surface Ag: NEGATIVE

## 2018-05-21 LAB — OB RESULTS CONSOLE ABO/RH: RH Type: POSITIVE

## 2018-05-21 LAB — OB RESULTS CONSOLE GC/CHLAMYDIA
Chlamydia: NEGATIVE
Gonorrhea: NEGATIVE

## 2018-05-21 LAB — OB RESULTS CONSOLE ANTIBODY SCREEN: Antibody Screen: NEGATIVE

## 2018-05-21 LAB — OB RESULTS CONSOLE RUBELLA ANTIBODY, IGM: Rubella: NON-IMMUNE/NOT IMMUNE

## 2018-05-21 LAB — OB RESULTS CONSOLE RPR: RPR: NONREACTIVE

## 2018-06-25 NOTE — L&D Delivery Note (Signed)
Delivery Note Patient pushed well for approximately 1 hour.  At 6:29 AM a viable female was delivered via Vaginal, Spontaneous in the LOA position.  Delivery of the fetal head was slow and shoulder dystocia was noted.  Additional help was requested.  The patient was instructed to stop pushing.  The following maneuvers were employed in the order documented below.  After delivery, the cord was immediately cut and clamped and the baby taken to the warmer for evaluation by the NICU team.   Delivery of the head: 11/28/2018  6:27 AM First maneuver: 11/28/2018  6:27 AM, McRoberts Second maneuver: 11/28/2018  6:27 AM, Suprapubic Pressure Third maneuver: 11/28/2018  6:28 AM, Posterior arm, not able to be easily delivered as forearm was behind the back. Fourth maneuver: 11/28/2018  6:28 AM, Joseph Art screw (fetal shoulder rotation) Fifth maneuver: 11/28/2018  6:29 AM, Suprapubic Pressure Sixth maneuver: ,     APGAR: 6, 8; weight 3560 gm (7lb 13oz) .   Placenta status: Spontaneous, 3V cord .   Cord: 3V with the following complications None.  Cord pH: 7.298, pCO2 54, bicarb 26, base excess -0.4  After delivery, the uterus was noted to be boggy.  Pitocin and bimanual massage were employed, followed by 0.25mg  of hemabate and of rectal cytotec.  Uterine tone slowly improved and bleeding became minimal.    Anesthesia:  Epidural Episiotomy: None Lacerations: 1st degree Suture Repair: 3.0 vicryl rapide Est. Blood Loss (mL): 1073  Mom to postpartum.  Baby to Couplet care / Skin to Skin.  Allura Doepke GEFFEL Marcene Laskowski 11/28/2018, 7:13 AM

## 2018-11-06 LAB — OB RESULTS CONSOLE GBS: GBS: POSITIVE

## 2018-11-18 ENCOUNTER — Encounter (HOSPITAL_COMMUNITY): Payer: Self-pay | Admitting: *Deleted

## 2018-11-18 ENCOUNTER — Other Ambulatory Visit: Payer: Self-pay

## 2018-11-18 ENCOUNTER — Inpatient Hospital Stay (HOSPITAL_COMMUNITY)
Admission: AD | Admit: 2018-11-18 | Discharge: 2018-11-18 | Disposition: A | Discharge status: Home / Self Care | Payer: Medicaid Other | Source: Ambulatory Visit | Attending: Obstetrics | Admitting: Obstetrics

## 2018-11-18 DIAGNOSIS — O10919 Unspecified pre-existing hypertension complicating pregnancy, unspecified trimester: Secondary | ICD-10-CM | POA: Diagnosis present

## 2018-11-18 DIAGNOSIS — Z3689 Encounter for other specified antenatal screening: Secondary | ICD-10-CM

## 2018-11-18 DIAGNOSIS — Z3A38 38 weeks gestation of pregnancy: Secondary | ICD-10-CM | POA: Diagnosis not present

## 2018-11-18 DIAGNOSIS — Z8249 Family history of ischemic heart disease and other diseases of the circulatory system: Secondary | ICD-10-CM | POA: Insufficient documentation

## 2018-11-18 DIAGNOSIS — Z79899 Other long term (current) drug therapy: Secondary | ICD-10-CM | POA: Diagnosis not present

## 2018-11-18 DIAGNOSIS — O10913 Unspecified pre-existing hypertension complicating pregnancy, third trimester: Secondary | ICD-10-CM | POA: Diagnosis not present

## 2018-11-18 DIAGNOSIS — O368130 Decreased fetal movements, third trimester, not applicable or unspecified: Secondary | ICD-10-CM | POA: Diagnosis present

## 2018-11-18 HISTORY — DX: Unspecified pre-existing hypertension complicating pregnancy, unspecified trimester: O10.919

## 2018-11-18 LAB — COMPREHENSIVE METABOLIC PANEL
ALT: 20 U/L (ref 0–44)
AST: 15 U/L (ref 15–41)
Albumin: 2.5 g/dL — ABNORMAL LOW (ref 3.5–5.0)
Alkaline Phosphatase: 125 U/L (ref 38–126)
Anion gap: 7 (ref 5–15)
BUN: 6 mg/dL (ref 6–20)
CO2: 21 mmol/L — ABNORMAL LOW (ref 22–32)
Calcium: 8.9 mg/dL (ref 8.9–10.3)
Chloride: 110 mmol/L (ref 98–111)
Creatinine, Ser: 0.47 mg/dL (ref 0.44–1.00)
GFR calc Af Amer: >60 mL/min (ref >60)
GFR calc non Af Amer: >60 mL/min (ref >60)
Glucose, Bld: 96 mg/dL (ref 70–99)
Potassium: 3.8 mmol/L (ref 3.5–5.1)
Sodium: 138 mmol/L (ref 135–145)
Total Bilirubin: 0.4 mg/dL (ref 0.3–1.2)
Total Protein: 5.5 g/dL — ABNORMAL LOW (ref 6.5–8.1)

## 2018-11-18 LAB — URINALYSIS, ROUTINE W REFLEX MICROSCOPIC
Bilirubin Urine: NEGATIVE
Glucose, UA: NEGATIVE mg/dL
Hgb urine dipstick: NEGATIVE
Ketones, ur: NEGATIVE mg/dL
Nitrite: NEGATIVE
Protein, ur: NEGATIVE mg/dL
Specific Gravity, Urine: 1.006 (ref 1.005–1.030)
pH: 8 (ref 5.0–8.0)

## 2018-11-18 LAB — PROTEIN / CREATININE RATIO, URINE
Creatinine, Urine: 41.72 mg/dL
Total Protein, Urine: <6 mg/dL

## 2018-11-18 LAB — CBC
HCT: 41.3 % (ref 36.0–46.0)
Hemoglobin: 13.4 g/dL (ref 12.0–15.0)
MCH: 27.9 pg (ref 26.0–34.0)
MCHC: 32.4 g/dL (ref 30.0–36.0)
MCV: 86 fL (ref 80.0–100.0)
Platelets: 170 10*3/uL (ref 150–400)
RBC: 4.8 MIL/uL (ref 3.87–5.11)
RDW: 14.6 % (ref 11.5–15.5)
WBC: 9.2 10*3/uL (ref 4.0–10.5)
nRBC: 0 % (ref 0.0–0.2)

## 2018-11-18 NOTE — Discharge Instructions (Signed)
Take you blood pressure twice a day. Please call [facility], if your blood pressures are over 140s-150s/100s. Dr. Chestine Spore will get your induction of labor scheduled for 39 weeks.

## 2018-11-18 NOTE — MAU Note (Signed)
Presents with decreaed FM.  States only felt 1 movement since 0955 this morning.  Reports has eaten breakfast.  Denies VB or LOF.

## 2018-11-18 NOTE — MAU Provider Note (Signed)
History     CSN: [id number]  Arrival date and time: 11/18/18 1053   Provider's First contact with patient at 1135    Chief Complaint  Patient presents with  . Decreased Fetal Movement   HPI  Ms.  Eileen Buck is a 23 y.o. year old G1P0 female at [redacted]w[redacted]d weeks gestation who presents to MAU reporting DFM this morning. She reports that she has only felt FM once this morning at 0955. She ate breakfast and "even drank a [place]. Dew" to get the baby to move more. She called the OB office to report DFM and was instructed to come to MAU for NST and evaluation. Since arriving to MAU, she has had 1 severe range BP and another moderate BP. She has a h/o cHTN, does not take anti-hypertensive meds for, but takes a daily bASA. She denies H/A, visual disturbances, epigastric pain, N/V, or increased swelling.    Past Medical History:  Diagnosis Date  . Hypertension   . Nausea   . Weakness     Past Surgical History:  Procedure Laterality Date  . ANKLE SURGERY  04/2005  . APPENDECTOMY  09/16/11  . LAPAROSCOPIC APPENDECTOMY  09/16/2011   Procedure: APPENDECTOMY LAPAROSCOPIC;  Surgeon: Velora Heckler, MD;  Location: [facility];  Service: General;  Laterality: N/A;  . NASAL SINUS SURGERY  08/2010    Family History  Problem Relation Age of Onset  . Hypertension Mother   . Heart disease Father   . Cancer Paternal Grandmother        ovarian    Social History   Tobacco Use  . Smoking status: Never Smoker  . Smokeless tobacco: Never Used  Substance Use Topics  . Alcohol use: No  . Drug use: No    Allergies: No Known Allergies  Medications Prior to Admission  Medication Sig Dispense Refill Last Dose  . acetaminophen (TYLENOL) 500 MG tablet Take 1,000 mg by mouth every 6 (six) hours as needed for mild pain or headache.   Past Week at Unknown time  . aspirin EC 81 MG tablet Take 81 mg by mouth daily.   11/18/2018 at 0715  . cetirizine (ZYRTEC) 10 MG tablet Take 10 mg by mouth daily.   11/18/2018 at  0715  . folic acid (FOLVITE) 1 MG tablet Take 1 mg by mouth daily.   11/18/2018 at 0715  . Prenatal Vit-Fe Fumarate-FA (PRENATAL MULTIVITAMIN) TABS tablet Take 1 tablet by mouth daily at 12 noon.   11/18/2018 at 0715  . promethazine (PHENERGAN) 25 MG tablet Take 25 mg by mouth every 6 (six) hours as needed for nausea or vomiting.   Past Month at Unknown time  . amoxicillin-clavulanate (AUGMENTIN) 875-125 MG per tablet Take 1 tablet by mouth 2 (two) times daily. (Patient not taking: Reported on 04/30/2018) 14 tablet 0 Not Taking at Unknown time  . azithromycin (ZITHROMAX) 250 MG tablet Take 1,000 mg by mouth once.  0 04/29/2018 at Unknown time  . ondansetron (ZOFRAN) 8 MG tablet Take 8 mg by mouth every 8 (eight) hours as needed for nausea/vomiting.  0 unk at prn    Review of Systems  Constitutional: Negative.   HENT: Negative.   Eyes: Negative.   Respiratory: Negative.   Cardiovascular: Negative.   Gastrointestinal: Negative.   Endocrine: Negative.   Genitourinary:       DFM this morning  Musculoskeletal: Negative.   Skin: Negative.   Allergic/Immunologic: Negative.   Neurological: Negative.   Hematological: Negative.   Psychiatric/Behavioral:  Negative.    Physical Exam   Blood pressure (!) 149/102, pulse 99, temperature 98.4 F (36.9 C), temperature source Oral, resp. rate 20, height 5\' 7"  (1.702 m), weight 129.9 kg, SpO2 99 %.  Physical Exam  Nursing note and vitals reviewed. Constitutional: She is oriented to person, place, and time. She appears well-developed and well-nourished.  HENT:  Head: Normocephalic and atraumatic.  Eyes: Pupils are equal, round, and reactive to light.  Neck: Normal range of motion.  Cardiovascular: Normal rate, regular rhythm, normal heart sounds and intact distal pulses.  Respiratory: Effort normal and breath sounds normal.  GI: Soft. Bowel sounds are normal.  Difficult to assess fetal positioning d/t maternal body habitus  Genitourinary:     Genitourinary Comments: Pelvic not indicated   Musculoskeletal: Normal range of motion.  Neurological: She is alert and oriented to person, place, and time. She has normal reflexes.  Skin: Skin is warm and dry.  Psychiatric: She has a normal mood and affect. Her behavior is normal. Judgment and thought content normal.    MAU Course  Procedures  MDM CCUA CBC CMP P/C Ratio Serial BP's  NST - FHR: 145 bpm / moderate variability / accels present / decels absent / TOCO: Occ UC with UI noted  *Consult with Dr. Glade Nurse @ 1434 - notified of patient's complaints, assessments, lab, U/S, and NST results, recommended tx plan NST & BP recheck tomorrow, start Procardia 30 mg XL, IOL at 39 wks, call [facility] on-call MD to see what they want to do  *Consult with Dr. Chestine Spore @ 1443 - notified of patient's complaints, assessments, lab, U/S, NST results, & Dr. Vergie Living' recommended tx plan -- ok to d/c home, no need to start meds d/t baseline BPs being close to BP from today's visit, will have office call her to get her in the office for a BP recheck and NST and will get her scheduled for IOL at 39 wks   Results for orders placed or performed during the hospital encounter of 11/18/18 (from the past 24 hour(s))  Urinalysis, Routine w reflex microscopic     Status: Abnormal   Collection Time: 11/18/18 11:19 AM  Result Value Ref Range   Color, Urine YELLOW YELLOW   APPearance CLEAR CLEAR   Specific Gravity, Urine 1.006 1.005 - 1.030   pH 8.0 5.0 - 8.0   Glucose, UA NEGATIVE NEGATIVE mg/dL   Hgb urine dipstick NEGATIVE NEGATIVE   Bilirubin Urine NEGATIVE NEGATIVE   Ketones, ur NEGATIVE NEGATIVE mg/dL   Protein, ur NEGATIVE NEGATIVE mg/dL   Nitrite NEGATIVE NEGATIVE   Leukocytes,Ua TRACE (A) NEGATIVE   RBC / HPF 0-5 0 - 5 RBC/hpf   WBC, UA 0-5 0 - 5 WBC/hpf   Bacteria, UA FEW (A) NONE SEEN   Squamous Epithelial / LPF 0-5 0 - 5  Protein / creatinine ratio, urine     Status: None   Collection Time:  11/18/18 11:19 AM  Result Value Ref Range   Creatinine, Urine 41.72 mg/dL   Total Protein, Urine <6 mg/dL   Protein Creatinine Ratio        0.00 - 0.15 mg/mg[Cre]  CBC     Status: None   Collection Time: 11/18/18 11:44 AM  Result Value Ref Range   WBC 9.2 4.0 - 10.5 K/uL   RBC 4.80 3.87 - 5.11 MIL/uL   Hemoglobin 13.4 12.0 - 15.0 g/dL   HCT 38.1 82.9 - 93.7 %   MCV 86.0 80.0 - 100.0 fL  MCH 27.9 26.0 - 34.0 pg   MCHC 32.4 30.0 - 36.0 g/dL   RDW 56.214.6 13.011.5 - 86.515.5 %   Platelets 170 150 - 400 K/uL   nRBC 0.0 0.0 - 0.2 %  Comprehensive metabolic panel     Status: Abnormal   Collection Time: 11/18/18 11:44 AM  Result Value Ref Range   Sodium 138 135 - 145 mmol/L   Potassium 3.8 3.5 - 5.1 mmol/L   Chloride 110 98 - 111 mmol/L   CO2 21 (L) 22 - 32 mmol/L   Glucose, Bld 96 70 - 99 mg/dL   BUN 6 6 - 20 mg/dL   Creatinine, Ser 7.840.47 0.44 - 1.00 mg/dL   Calcium 8.9 8.9 - 69.610.3 mg/dL   Total Protein 5.5 (L) 6.5 - 8.1 g/dL   Albumin 2.5 (L) 3.5 - 5.0 g/dL   AST 15 15 - 41 U/L   ALT 20 0 - 44 U/L   Alkaline Phosphatase 125 38 - 126 U/L   Total Bilirubin 0.4 0.3 - 1.2 mg/dL   GFR calc non Af Amer >60 >60 mL/min   GFR calc Af Amer >60 >60 mL/min   Anion gap 7 5 - 15      Assessment and Plan  Chronic hypertension affecting pregnancy  - Information provided on HTN in pregnancy & PEC - Reviewed PEC s/s to return to MAU for - Dr. Chestine Sporelark will have someone from [facility] will call her for F/U appt for either 5/27/ or 5/28   NST (non-stress test) reactive  - Reassurance given that fetal well-being is good at today's visit - Reviewed FKC - Advised to return to MAU & call OB office for no or DFM   - Discharge patient - Keep scheduled appt with [facility] - Patient verbalized an understanding of the plan of care and agrees.     Raelyn Moraolitta Jahnessa Vanduyn, MSN, CNM 11/18/2018, 11:42 AM

## 2018-11-19 ENCOUNTER — Encounter (HOSPITAL_COMMUNITY): Payer: Self-pay | Admitting: *Deleted

## 2018-11-19 ENCOUNTER — Other Ambulatory Visit: Payer: Self-pay | Admitting: Obstetrics and Gynecology

## 2018-11-19 ENCOUNTER — Telehealth (HOSPITAL_COMMUNITY): Payer: Self-pay | Admitting: *Deleted

## 2018-11-19 NOTE — Telephone Encounter (Signed)
Preadmission screen  

## 2018-11-20 ENCOUNTER — Telehealth (HOSPITAL_COMMUNITY): Payer: Self-pay | Admitting: *Deleted

## 2018-11-20 ENCOUNTER — Encounter (HOSPITAL_COMMUNITY): Payer: Self-pay | Admitting: *Deleted

## 2018-11-20 NOTE — Telephone Encounter (Signed)
Preadmission screen  

## 2018-11-25 ENCOUNTER — Other Ambulatory Visit: Payer: Self-pay

## 2018-11-25 ENCOUNTER — Other Ambulatory Visit (HOSPITAL_COMMUNITY)
Admission: RE | Admit: 2018-11-25 | Discharge: 2018-11-25 | Disposition: A | Discharge status: Home / Self Care | Payer: Medicaid Other | Source: Ambulatory Visit | Attending: Obstetrics and Gynecology | Admitting: Obstetrics and Gynecology

## 2018-11-25 DIAGNOSIS — Z1159 Encounter for screening for other viral diseases: Secondary | ICD-10-CM | POA: Insufficient documentation

## 2018-11-25 NOTE — MAU Note (Signed)
Preadmission COVID swab collected. Pt asymptomatic. Specimen sent to lab. 

## 2018-11-26 ENCOUNTER — Other Ambulatory Visit (HOSPITAL_COMMUNITY): Payer: Self-pay | Admitting: *Deleted

## 2018-11-26 LAB — NOVEL CORONAVIRUS, NAA (HOSP ORDER, SEND-OUT TO REF LAB; TAT 18-24 HRS): SARS-CoV-2, NAA: NOT DETECTED

## 2018-11-27 ENCOUNTER — Other Ambulatory Visit: Payer: Self-pay

## 2018-11-27 ENCOUNTER — Inpatient Hospital Stay (HOSPITAL_COMMUNITY): Payer: Medicaid Other | Admitting: Anesthesiology

## 2018-11-27 ENCOUNTER — Inpatient Hospital Stay (HOSPITAL_COMMUNITY)
Admission: AD | Admit: 2018-11-27 | Discharge: 2018-11-30 | DRG: 807 | Disposition: A | Discharge status: Home / Self Care | Payer: Medicaid Other | Attending: Obstetrics | Admitting: Obstetrics

## 2018-11-27 ENCOUNTER — Inpatient Hospital Stay (HOSPITAL_COMMUNITY): Payer: Medicaid Other

## 2018-11-27 ENCOUNTER — Encounter (HOSPITAL_COMMUNITY): Payer: Self-pay

## 2018-11-27 DIAGNOSIS — Z7982 Long term (current) use of aspirin: Secondary | ICD-10-CM | POA: Diagnosis not present

## 2018-11-27 DIAGNOSIS — O114 Pre-existing hypertension with pre-eclampsia, complicating childbirth: Secondary | ICD-10-CM | POA: Diagnosis present

## 2018-11-27 DIAGNOSIS — Z3689 Encounter for other specified antenatal screening: Secondary | ICD-10-CM

## 2018-11-27 DIAGNOSIS — Z3A39 39 weeks gestation of pregnancy: Secondary | ICD-10-CM | POA: Diagnosis not present

## 2018-11-27 DIAGNOSIS — O1002 Pre-existing essential hypertension complicating childbirth: Secondary | ICD-10-CM | POA: Diagnosis present

## 2018-11-27 DIAGNOSIS — O99824 Streptococcus B carrier state complicating childbirth: Secondary | ICD-10-CM | POA: Diagnosis present

## 2018-11-27 DIAGNOSIS — I10 Essential (primary) hypertension: Secondary | ICD-10-CM | POA: Diagnosis present

## 2018-11-27 DIAGNOSIS — O10919 Unspecified pre-existing hypertension complicating pregnancy, unspecified trimester: Secondary | ICD-10-CM

## 2018-11-27 LAB — COMPREHENSIVE METABOLIC PANEL
ALT: 20 U/L (ref 0–44)
AST: 16 U/L (ref 15–41)
Albumin: 2.6 g/dL — ABNORMAL LOW (ref 3.5–5.0)
Alkaline Phosphatase: 141 U/L — ABNORMAL HIGH (ref 38–126)
Anion gap: 10 (ref 5–15)
BUN: 5 mg/dL — ABNORMAL LOW (ref 6–20)
CO2: 20 mmol/L — ABNORMAL LOW (ref 22–32)
Calcium: 8.8 mg/dL — ABNORMAL LOW (ref 8.9–10.3)
Chloride: 107 mmol/L (ref 98–111)
Creatinine, Ser: 0.6 mg/dL (ref 0.44–1.00)
GFR calc Af Amer: >60 mL/min (ref >60)
GFR calc non Af Amer: >60 mL/min (ref >60)
Glucose, Bld: 95 mg/dL (ref 70–99)
Potassium: 3.8 mmol/L (ref 3.5–5.1)
Sodium: 137 mmol/L (ref 135–145)
Total Bilirubin: 0.5 mg/dL (ref 0.3–1.2)
Total Protein: 5.8 g/dL — ABNORMAL LOW (ref 6.5–8.1)

## 2018-11-27 LAB — CBC
HCT: 40.8 % (ref 36.0–46.0)
HCT: 39.9 % (ref 36.0–46.0)
Hemoglobin: 13.2 g/dL (ref 12.0–15.0)
Hemoglobin: 13.2 g/dL (ref 12.0–15.0)
MCH: 27.8 pg (ref 26.0–34.0)
MCH: 28.4 pg (ref 26.0–34.0)
MCHC: 32.4 g/dL (ref 30.0–36.0)
MCHC: 33.1 g/dL (ref 30.0–36.0)
MCV: 85.9 fL (ref 80.0–100.0)
MCV: 85.8 fL (ref 80.0–100.0)
Platelets: 168 10*3/uL (ref 150–400)
Platelets: 168 10*3/uL (ref 150–400)
RBC: 4.75 MIL/uL (ref 3.87–5.11)
RBC: 4.65 MIL/uL (ref 3.87–5.11)
RDW: 14.7 % (ref 11.5–15.5)
RDW: 14.8 % (ref 11.5–15.5)
WBC: 11.1 10*3/uL — ABNORMAL HIGH (ref 4.0–10.5)
WBC: 9.7 10*3/uL (ref 4.0–10.5)
nRBC: 0 % (ref 0.0–0.2)
nRBC: 0 % (ref 0.0–0.2)

## 2018-11-27 LAB — TYPE AND SCREEN
ABO/RH(D): A POS
Antibody Screen: NEGATIVE

## 2018-11-27 LAB — RPR: RPR Ser Ql: NONREACTIVE

## 2018-11-27 LAB — ABO/RH: ABO/RH(D): A POS

## 2018-11-27 MED ORDER — MAGNESIUM SULFATE 40 G IN LACTATED RINGERS - SIMPLE
2.0000 g/h | INTRAVENOUS | Status: DC
  Administered 2018-11-27 – 2018-11-28 (×2): 2 g/h via INTRAVENOUS
  Filled 2018-11-27 (×3): qty 500

## 2018-11-27 MED ORDER — MISOPROSTOL 25 MCG QUARTER TABLET
25.0000 ug | ORAL_TABLET | ORAL | Status: DC | PRN
  Administered 2018-11-27 (×2): 25 ug via VAGINAL
  Filled 2018-11-27 (×3): qty 1

## 2018-11-27 MED ORDER — EPHEDRINE 5 MG/ML INJ
10.0000 mg | INTRAVENOUS | Status: DC | PRN
  Filled 2018-11-27: qty 10
  Filled 2018-11-27: qty 2

## 2018-11-27 MED ORDER — ACETAMINOPHEN 325 MG PO TABS
650.0000 mg | ORAL_TABLET | ORAL | Status: DC | PRN
  Administered 2018-11-27 – 2018-11-28 (×2): 650 mg via ORAL
  Filled 2018-11-27 (×2): qty 2

## 2018-11-27 MED ORDER — OXYTOCIN 40 UNITS IN NORMAL SALINE INFUSION - SIMPLE MED
2.5000 [IU]/h | INTRAVENOUS | Status: DC
  Administered 2018-11-28: 2.5 [IU]/h via INTRAVENOUS
  Filled 2018-11-27 (×2): qty 1000

## 2018-11-27 MED ORDER — SODIUM CHLORIDE 0.9 % IV SOLN
5.0000 10*6.[IU] | Freq: Once | INTRAVENOUS | Status: AC
  Administered 2018-11-27: 5 10*6.[IU] via INTRAVENOUS
  Filled 2018-11-27: qty 5

## 2018-11-27 MED ORDER — FENTANYL CITRATE (PF) 100 MCG/2ML IJ SOLN
100.0000 ug | Freq: Once | INTRAMUSCULAR | Status: AC
  Administered 2018-11-27: 100 ug via INTRAVENOUS
  Filled 2018-11-27: qty 2

## 2018-11-27 MED ORDER — EPHEDRINE 5 MG/ML INJ
10.0000 mg | INTRAVENOUS | Status: DC | PRN
  Filled 2018-11-27: qty 2

## 2018-11-27 MED ORDER — SOD CITRATE-CITRIC ACID 500-334 MG/5ML PO SOLN: 30.0000 mL | ORAL | Status: DC | PRN

## 2018-11-27 MED ORDER — OXYTOCIN 40 UNITS IN NORMAL SALINE INFUSION - SIMPLE MED
1.0000 m[IU]/min | INTRAVENOUS | Status: DC
  Administered 2018-11-27: 10:00:00 2 m[IU]/min via INTRAVENOUS

## 2018-11-27 MED ORDER — PENICILLIN G 3 MILLION UNITS IVPB - SIMPLE MED
3.0000 10*6.[IU] | INTRAVENOUS | Status: DC
  Administered 2018-11-27 – 2018-11-28 (×6): 3 10*6.[IU] via INTRAVENOUS
  Filled 2018-11-27 (×11): qty 100

## 2018-11-27 MED ORDER — LACTATED RINGERS IV SOLN: 500.0000 mL | INTRAVENOUS | Status: DC | PRN

## 2018-11-27 MED ORDER — FENTANYL-BUPIVACAINE-NACL 0.5-0.125-0.9 MG/250ML-% EP SOLN
12.0000 mL/h | EPIDURAL | Status: DC | PRN
  Administered 2018-11-27 – 2018-11-28 (×2): 12 mL/h via EPIDURAL
  Filled 2018-11-27 (×2): qty 250

## 2018-11-27 MED ORDER — LIDOCAINE HCL (PF) 1 % IJ SOLN
INTRAMUSCULAR | Status: DC | PRN
  Administered 2018-11-27 (×2): 5 mL via EPIDURAL

## 2018-11-27 MED ORDER — HYDRALAZINE HCL 20 MG/ML IJ SOLN: 10.0000 mg | INTRAMUSCULAR | Status: DC | PRN

## 2018-11-27 MED ORDER — OXYTOCIN BOLUS FROM INFUSION
500.0000 mL | Freq: Once | INTRAVENOUS | Status: AC
  Administered 2018-11-28: 500 mL via INTRAVENOUS

## 2018-11-27 MED ORDER — PHENYLEPHRINE 40 MCG/ML (10ML) SYRINGE FOR IV PUSH (FOR BLOOD PRESSURE SUPPORT)
80.0000 ug | PREFILLED_SYRINGE | INTRAVENOUS | Status: DC | PRN
  Administered 2018-11-27: 80 ug via INTRAVENOUS
  Filled 2018-11-27: qty 10

## 2018-11-27 MED ORDER — TERBUTALINE SULFATE 1 MG/ML IJ SOLN: 0.2500 mg | Freq: Once | INTRAMUSCULAR | Status: DC | PRN

## 2018-11-27 MED ORDER — LABETALOL HCL 5 MG/ML IV SOLN
80.0000 mg | INTRAVENOUS | Status: DC | PRN
  Filled 2018-11-27: qty 16

## 2018-11-27 MED ORDER — LABETALOL HCL 5 MG/ML IV SOLN
INTRAVENOUS | Status: AC
  Filled 2018-11-27: qty 4

## 2018-11-27 MED ORDER — FENTANYL-BUPIVACAINE-NACL 0.5-0.125-0.9 MG/250ML-% EP SOLN
EPIDURAL | Status: AC
  Filled 2018-11-27: qty 250

## 2018-11-27 MED ORDER — LABETALOL HCL 5 MG/ML IV SOLN
20.0000 mg | INTRAVENOUS | Status: DC | PRN
  Administered 2018-11-27: 20 mg via INTRAVENOUS

## 2018-11-27 MED ORDER — ONDANSETRON HCL 4 MG/2ML IJ SOLN
4.0000 mg | Freq: Four times a day (QID) | INTRAMUSCULAR | Status: DC | PRN
  Administered 2018-11-27 – 2018-11-28 (×3): 4 mg via INTRAVENOUS
  Filled 2018-11-27 (×3): qty 2

## 2018-11-27 MED ORDER — SODIUM CHLORIDE (PF) 0.9 % IJ SOLN
INTRAMUSCULAR | Status: DC | PRN
  Administered 2018-11-27: 14 mL/h via EPIDURAL

## 2018-11-27 MED ORDER — LIDOCAINE HCL (PF) 1 % IJ SOLN: 30.0000 mL | INTRAMUSCULAR | Status: DC | PRN

## 2018-11-27 MED ORDER — LACTATED RINGERS IV SOLN
INTRAVENOUS | Status: DC
  Administered 2018-11-27 (×3): via INTRAVENOUS

## 2018-11-27 MED ORDER — DIPHENHYDRAMINE HCL 50 MG/ML IJ SOLN: 12.5000 mg | INTRAMUSCULAR | Status: DC | PRN

## 2018-11-27 MED ORDER — LABETALOL HCL 5 MG/ML IV SOLN
40.0000 mg | INTRAVENOUS | Status: DC | PRN
  Administered 2018-11-27: 40 mg via INTRAVENOUS
  Filled 2018-11-27: qty 8

## 2018-11-27 MED ORDER — LACTATED RINGERS IV SOLN: 500.0000 mL | Freq: Once | INTRAVENOUS | Status: DC

## 2018-11-27 MED ORDER — MAGNESIUM SULFATE BOLUS VIA INFUSION
4.0000 g | Freq: Once | INTRAVENOUS | Status: AC
  Administered 2018-11-27: 4 g via INTRAVENOUS
  Filled 2018-11-27: qty 500

## 2018-11-27 MED ORDER — PHENYLEPHRINE 40 MCG/ML (10ML) SYRINGE FOR IV PUSH (FOR BLOOD PRESSURE SUPPORT)
80.0000 ug | PREFILLED_SYRINGE | INTRAVENOUS | Status: DC | PRN
  Filled 2018-11-27 (×3): qty 10

## 2018-11-27 MED ORDER — OXYCODONE HCL 5 MG PO TABS
5.0000 mg | ORAL_TABLET | Freq: Once | ORAL | Status: AC
  Administered 2018-11-27: 5 mg via ORAL
  Filled 2018-11-27: qty 1

## 2018-11-27 NOTE — Anesthesia Preprocedure Evaluation (Signed)
Anesthesia Evaluation  Patient identified by MRN, date of birth, ID band Patient awake    Reviewed: Allergy & Precautions, NPO status , Patient's Chart, lab work & pertinent test results  Airway Mallampati: II  TM Distance: >3 FB Neck ROM: Full    Dental no notable dental hx.    Pulmonary neg pulmonary ROS,    Pulmonary exam normal breath sounds clear to auscultation       Cardiovascular hypertension, Pt. on medications negative cardio ROS Normal cardiovascular exam Rhythm:Regular Rate:Normal     Neuro/Psych negative neurological ROS  negative psych ROS   GI/Hepatic negative GI ROS, Neg liver ROS,   Endo/Other  negative endocrine ROS  Renal/GU negative Renal ROS  negative genitourinary   Musculoskeletal negative musculoskeletal ROS (+)   Abdominal   Peds negative pediatric ROS (+)  Hematology negative hematology ROS (+)   Anesthesia Other Findings   Reproductive/Obstetrics (+) Pregnancy                             Anesthesia Physical Anesthesia Plan  ASA: II  Anesthesia Plan: Epidural   Post-op Pain Management:    Induction:   PONV Risk Score and Plan:   Airway Management Planned:   Additional Equipment:   Intra-op Plan:   Post-operative Plan:   Informed Consent: I have reviewed the patients History and Physical, chart, labs and discussed the procedure including the risks, benefits and alternatives for the proposed anesthesia with the patient or authorized representative who has indicated his/her understanding and acceptance.       Plan Discussed with:   Anesthesia Plan Comments:         Anesthesia Quick Evaluation

## 2018-11-27 NOTE — Anesthesia Procedure Notes (Signed)
Epidural Patient location during procedure: OB  Staffing Anesthesiologist: Gaila Engebretsen, MD Performed: anesthesiologist   Preanesthetic Checklist Completed: patient identified, site marked, surgical consent, pre-op evaluation, timeout performed, IV checked, risks and benefits discussed and monitors and equipment checked  Epidural Patient position: sitting Prep: DuraPrep Patient monitoring: heart rate, continuous pulse ox and blood pressure Approach: midline Location: L3-L4 Injection technique: LOR saline  Needle:  Needle type: Tuohy  Needle gauge: 17 G Needle length: 9 cm and 9 Needle insertion depth: 8 cm Catheter type: closed end flexible Catheter size: 20 Guage Catheter at skin depth: 12 cm Test dose: negative  Assessment Events: blood not aspirated, injection not painful, no injection resistance, negative IV test and no paresthesia  Additional Notes Patient identified. Risks/Benefits/Options discussed with patient including but not limited to bleeding, infection, nerve damage, paralysis, failed block, incomplete pain control, headache, blood pressure changes, nausea, vomiting, reactions to medication both or allergic, itching and postpartum back pain. Confirmed with bedside nurse the patient's most recent platelet count. Confirmed with patient that they are not currently taking any anticoagulation, have any bleeding history or any family history of bleeding disorders. Patient expressed understanding and wished to proceed. All questions were answered. Sterile technique was used throughout the entire procedure. Please see nursing notes for vital signs. Test dose was given through epidural needle and negative prior to continuing to dose epidural or start infusion. Warning signs of high block given to the patient including shortness of breath, tingling/numbness in hands, complete motor block, or any concerning symptoms with instructions to call for help. Patient was given  instructions on fall risk and not to get out of bed. All questions and concerns addressed with instructions to call with any issues.     

## 2018-11-27 NOTE — Progress Notes (Signed)
Mild headache.    BP (!) 152/81   Pulse 91   Temp 98.3 F (36.8 C) (Oral)   Resp 18   Ht 5\' 7"  (1.702 m)   Wt 106.5 kg   SpO2 98%   BMI 36.77 kg/m   NAD Abdomen:  Obese SVE: 4/50/-2, AROM clear fluid, IUPC placed  Toco: q 3 minutes EFM: 120s, moderate variability, + scalp stimulation with exam  G1 @ [redacted]w[redacted]d with IOL for CHTN at term  IOL--continue pitocin BPs stable <160/110.  Continue magnesium sulfate FSR / gbs neg

## 2018-11-27 NOTE — H&P (Signed)
23 y.o. G1P0 @ 6433w2d presents for induction of labor for chronic hypertension.  She has a history of hypertension for approximately 10 years.  She was not on medication at the start of pregnancy.  She was taking a baby aspirin daily for preeclampsia risk.  Her baseline blood pressures this pregnancy were 130-140/90s.  Upon presentation for her induction, she had multiple severe range pressures.  She received a total of 60mg  IV labetalol and was started on magnesium sulfate.  There is no evidence of HELLP. Otherwise has good fetal movement and no bleeding.  Past Medical History:  Diagnosis Date  . Anxiety    xanax in past  . Headache   . Hypertension   . Nausea   . Weakness     Past Surgical History:  Procedure Laterality Date  . ANKLE SURGERY  04/2005  . APPENDECTOMY  09/16/11  . LAPAROSCOPIC APPENDECTOMY  09/16/2011   Procedure: APPENDECTOMY LAPAROSCOPIC;  Surgeon: Velora Hecklerodd M Gerkin, MD;  Location: [facility];  Service: General;  Laterality: N/A;  . NASAL SINUS SURGERY  08/2010    OB History  Gravida Para Term Preterm AB Living  1            SAB TAB Ectopic Multiple Live Births               # Outcome Date GA Lbr Len/2nd Weight Sex Delivery Anes PTL Lv  1 Current             Social History   Socioeconomic History  . Marital status: Single    Spouse name: Not on file  . Number of children: Not on file  . Years of education: Not on file  . Highest education level: Not on file  Occupational History  . Not on file  Social Needs  . Financial resource strain: Not hard at all  . Food insecurity:    Worry: Never true    Inability: Never true  . Transportation needs:    Medical: No    Non-medical: Not on file  Tobacco Use  . Smoking status: Never Smoker  . Smokeless tobacco: Never Used  Substance and Sexual Activity  . Alcohol use: No  . Drug use: No  . Sexual activity: Yes    Birth control/protection: None   Patient has no known allergies.    Prenatal Transfer Tool  Maternal  Diabetes: No Genetic Screening: Normal first trimester screen Maternal Ultrasounds/Referrals: Normal Fetal Ultrasounds or other Referrals:  None Maternal Substance Abuse:  No Significant Maternal Medications:  Meds include: Other:  aspirin, zyrtec Significant Maternal Lab Results: Lab values include: Group B Strep positive  ABO, Rh: --/--/A POS, A POS Performed at [facility], [address] [place], [place] [address]  [phone](06/04 0202) Antibody: NEG (06/04 0202) Rubella: Nonimmune (11/27 0000) RPR: Nonreactive (11/27 0000)  HBsAg: Negative (11/27 0000)  HIV: Non-reactive (11/27 0000)  GBS: Positive (05/14 0000)     Growth US 11/21/2018: EFW 3703 gm (8#3) 80%, AFI 15   Vitals:   11/27/18 0631 11/27/18 0702  BP: (!) 154/103 (!) 154/91  Pulse: 92 90  Resp: 15 16  Temp:    SpO2:       General:  NAD Abdomen:  soft, gravid, EFW 8.5# Ex:  1+ edema SVE:  3/50/-3/soft/posterior, Foley bulb placed and filled with 60 cc of fluid FHTs:  120s, moderate variability, category 1 Toco:  irregular    A/P   23 y.o. G1P0 3433w2d presents with induction  of labor at term CHTN:  Not on baseline meds.  She had acutely elevated BPs upon admission that were treated with IV labetalol.  She has not had any additional severe range BPs.  She was started on magnesium sulfate for presume superimposed preeclampsia.  There is no evidence of HELLP.  It can be difficult to differentiate between superimposed preeclampsia and worsening chronic hypertension at term.  There was no urine p:c collected overnight.  Given the risks associated with preeclampsia, it is reasonable to continue MgSO4 throughout induction and for 24 hours postpartum  IOL--received 2 cytotec overnight, FB placed.  Will transition to pitocin at 1025 when able  FSR/ vtx/ GBS positive on PCN  Brook Geraci GEFFEL Lavena Loretto

## 2018-11-27 NOTE — Progress Notes (Signed)
Occ pressure.  IUPC with low MVUs--? Not working well vs inadequate contractions  BP 131/64   Pulse (!) 105   Temp 98.1 F (36.7 C) (Oral)   Resp 17   Ht 5\' 7"  (1.702 m)   Wt 106.5 kg   SpO2 95%   BMI 36.77 kg/m  EFM: 130s, moderate variability, + accels, no decels Toco:  q2 minutes--IUPC replaced and toco tracing much improved SVE: 8/80/-1  G1 @ 39.2 with IOL for CHTN BPs stable  IOL--continue pitocin fsr / gbs positive on pcn

## 2018-11-28 ENCOUNTER — Encounter (HOSPITAL_COMMUNITY): Payer: Self-pay | Admitting: *Deleted

## 2018-11-28 LAB — CBC
HCT: 40.8 % (ref 36.0–46.0)
HCT: 39.5 % (ref 36.0–46.0)
Hemoglobin: 13.3 g/dL (ref 12.0–15.0)
Hemoglobin: 12.8 g/dL (ref 12.0–15.0)
MCH: 28.5 pg (ref 26.0–34.0)
MCH: 28.1 pg (ref 26.0–34.0)
MCHC: 32.6 g/dL (ref 30.0–36.0)
MCHC: 32.4 g/dL (ref 30.0–36.0)
MCV: 87.4 fL (ref 80.0–100.0)
MCV: 86.8 fL (ref 80.0–100.0)
Platelets: 171 10*3/uL (ref 150–400)
Platelets: 164 10*3/uL (ref 150–400)
RBC: 4.67 MIL/uL (ref 3.87–5.11)
RBC: 4.55 MIL/uL (ref 3.87–5.11)
RDW: 14.7 % (ref 11.5–15.5)
RDW: 14.6 % (ref 11.5–15.5)
WBC: 16.8 10*3/uL — ABNORMAL HIGH (ref 4.0–10.5)
WBC: 17.4 10*3/uL — ABNORMAL HIGH (ref 4.0–10.5)
nRBC: 0 % (ref 0.0–0.2)
nRBC: 0 % (ref 0.0–0.2)

## 2018-11-28 MED ORDER — ONDANSETRON HCL 4 MG PO TABS: 4.0000 mg | ORAL_TABLET | ORAL | Status: DC | PRN

## 2018-11-28 MED ORDER — DIPHENHYDRAMINE HCL 25 MG PO CAPS: 25.0000 mg | ORAL_CAPSULE | Freq: Four times a day (QID) | ORAL | Status: DC | PRN

## 2018-11-28 MED ORDER — COCONUT OIL OIL
1.0000 "application " | TOPICAL_OIL | Status: DC | PRN
  Administered 2018-11-28: 1 via TOPICAL

## 2018-11-28 MED ORDER — OXYCODONE HCL 5 MG PO TABS: 5.0000 mg | ORAL_TABLET | ORAL | Status: DC | PRN

## 2018-11-28 MED ORDER — MISOPROSTOL 200 MCG PO TABS
ORAL_TABLET | ORAL | Status: AC
  Filled 2018-11-28: qty 5

## 2018-11-28 MED ORDER — BENZOCAINE-MENTHOL 20-0.5 % EX AERO
1.0000 "application " | INHALATION_SPRAY | CUTANEOUS | Status: DC | PRN
  Administered 2018-11-28 – 2018-11-30 (×2): 1 via TOPICAL
  Filled 2018-11-28 (×2): qty 56

## 2018-11-28 MED ORDER — WITCH HAZEL-GLYCERIN EX PADS: 1.0000 "application " | MEDICATED_PAD | CUTANEOUS | Status: DC | PRN

## 2018-11-28 MED ORDER — LACTATED RINGERS IV SOLN
INTRAVENOUS | Status: DC
  Administered 2018-11-28 – 2018-11-29 (×2): via INTRAVENOUS

## 2018-11-28 MED ORDER — PRENATAL MULTIVITAMIN CH
1.0000 | ORAL_TABLET | Freq: Every day | ORAL | Status: DC
  Administered 2018-11-28 – 2018-11-30 (×3): 1 via ORAL
  Filled 2018-11-28 (×3): qty 1

## 2018-11-28 MED ORDER — MISOPROSTOL 200 MCG PO TABS: 1000.0000 ug | ORAL_TABLET | Freq: Once | ORAL | Status: DC

## 2018-11-28 MED ORDER — MISOPROSTOL 200 MCG PO TABS
1000.0000 ug | ORAL_TABLET | Freq: Once | ORAL | Status: AC
  Administered 2018-11-28: 07:00:00 1000 ug via RECTAL

## 2018-11-28 MED ORDER — IBUPROFEN 600 MG PO TABS
600.0000 mg | ORAL_TABLET | Freq: Four times a day (QID) | ORAL | Status: DC
  Administered 2018-11-28 – 2018-11-30 (×9): 600 mg via ORAL
  Filled 2018-11-28 (×9): qty 1

## 2018-11-28 MED ORDER — CARBOPROST TROMETHAMINE 250 MCG/ML IM SOLN
INTRAMUSCULAR | Status: AC
  Filled 2018-11-28: qty 1

## 2018-11-28 MED ORDER — CARBOPROST TROMETHAMINE 250 MCG/ML IM SOLN
250.0000 ug | Freq: Once | INTRAMUSCULAR | Status: AC
  Administered 2018-11-28: 250 ug via INTRAMUSCULAR

## 2018-11-28 MED ORDER — ACETAMINOPHEN 325 MG PO TABS: 650.0000 mg | ORAL_TABLET | ORAL | Status: DC | PRN

## 2018-11-28 MED ORDER — OXYCODONE HCL 5 MG PO TABS: 10.0000 mg | ORAL_TABLET | ORAL | Status: DC | PRN

## 2018-11-28 MED ORDER — ONDANSETRON HCL 4 MG/2ML IJ SOLN: 4.0000 mg | INTRAMUSCULAR | Status: DC | PRN

## 2018-11-28 MED ORDER — SENNOSIDES-DOCUSATE SODIUM 8.6-50 MG PO TABS
2.0000 | ORAL_TABLET | ORAL | Status: DC
  Administered 2018-11-28 – 2018-11-29 (×2): 2 via ORAL
  Filled 2018-11-28 (×2): qty 2

## 2018-11-28 MED ORDER — DIBUCAINE (PERIANAL) 1 % EX OINT: 1.0000 "application " | TOPICAL_OINTMENT | CUTANEOUS | Status: DC | PRN

## 2018-11-28 MED ORDER — TETANUS-DIPHTH-ACELL PERTUSSIS 5-2.5-18.5 LF-MCG/0.5 IM SUSP: 0.5000 mL | Freq: Once | INTRAMUSCULAR | Status: DC

## 2018-11-28 MED ORDER — SIMETHICONE 80 MG PO CHEW: 80.0000 mg | CHEWABLE_TABLET | ORAL | Status: DC | PRN

## 2018-11-28 MED ORDER — TRANEXAMIC ACID-NACL 1000-0.7 MG/100ML-% IV SOLN
INTRAVENOUS | Status: AC
  Filled 2018-11-28: qty 100

## 2018-11-28 NOTE — Progress Notes (Signed)
New epidural bag hung by RN. Pt has been using PCA when not feeling pain. Pt instructed on proper use of PCA.

## 2018-11-28 NOTE — Lactation Note (Signed)
This note was copied from a baby's chart. Lactation Consultation Note  Patient Name: Eileen Buck [id number] Date: 11/28/2018 Reason for consult: Initial assessment;Primapara;1st time breastfeeding;Term  7 hours old FT female who is being exclusively BF by her mother, she's a P1. RN Eunice Blase has been very proactive and already set mom up with a hand pump, shells and a NS # 20. Baby is having difficulty latching on due to mom's flat nipples and large breast, mom's nipples come all the way down her abdomen, which makes positioning difficult. Mom is familiar with hand expression, when reviewing hand expression with her noticed that her right breast has some pitting edema (GTHN) and it's non-compressible, but the tissue in her left breast feels different and it's compressible, but left nipple is very flat, flatter than the right one, almost look inverted. Mom has 2 DEBP at home, a Spectra and a Medela.  Baby asleep when entering the room, but she started crying shortly after. Offered assistance with latch and mom agreed to have baby STS and give BF a try even though mom told LC baby wasn't due for a feeding; baby had her last feeding about two hours ago for 10 minutes and mom saw colostrum on the NS. LC took baby STS to mom's left breast in cross cradle position but baby would not stop crying, she'd suck on a gloved finger but she wouldn't suck at the breast. LC set mom up with a DEBP, instructions, cleaning and storage were reviewed.   Baby started to cry again while in [facility] consultation and this time, mom attempted the football hold, baby sucked on and off for about a minute, but LC had to hold mom's breast the entire time because otherwise her nipples are touching her abdomen when not holding the breast. An attempt and later a LATCH score were documented in Flowsheets. Reviewed normal newborn behavior, feeding cues and cluster feeding.  Feeding plan:  1. Encouraged mom to feed baby STS 8-12 times/24  hours or sooner if feeding cues are present using a NS # 20 2. Mom will pump every 3 hours after feedings and at least once at night; and will feed baby any amount of EBM she may get 3. She'll wear her shells during daytime only and use the hand pump prior feedings to try to evert her nipples  Parents aware that feeding plan will be revised again by the 24 hours mark but RN Debbie just notified LC that mom requested formula. BF brochure, BF resources and feeding diary were reviewed. Parents reported all questions and concerns were answered, they're both aware of LC OP services and will call PRN.  Maternal Data Formula Feeding for Exclusion: No Has patient been taught Hand Expression?: Yes Does the patient have breastfeeding experience prior to this delivery?: No  Feeding Feeding Type: Breast Fed  LATCH Score Latch: Repeated attempts needed to sustain latch, nipple held in mouth throughout feeding, stimulation needed to elicit sucking reflex.(with NS # 20)  Audible Swallowing: None  Type of Nipple: Flat  Comfort (Breast/Nipple): Soft / non-tender  Hold (Positioning): Assistance needed to correctly position infant at breast and maintain latch.  LATCH Score: 5  Interventions Interventions: Breast feeding basics reviewed;Assisted with latch;Skin to skin;Breast massage;Hand express;Breast compression;Adjust position;Support pillows;Position options;Shells;Hand pump;DEBP  Lactation Tools Discussed/Used Tools: Pump;Shells Breast pump type: Double-Electric Breast Pump;Manual WIC Program: No Pump Review: Setup, frequency, and cleaning Initiated by:: RN Debbie and MPeck Date initiated:: 11/28/18   Consult Status Consult Status: Follow-up  Date: 11/29/18 Follow-up type: In-patient    Rashaun Curl Venetia ConstableS Mirtie Bastyr 11/28/2018, 1:54 PM

## 2018-11-29 LAB — CBC
HCT: 33.5 % — ABNORMAL LOW (ref 36.0–46.0)
Hemoglobin: 10.9 g/dL — ABNORMAL LOW (ref 12.0–15.0)
MCH: 28.6 pg (ref 26.0–34.0)
MCHC: 32.5 g/dL (ref 30.0–36.0)
MCV: 87.9 fL (ref 80.0–100.0)
Platelets: 155 10*3/uL (ref 150–400)
RBC: 3.81 MIL/uL — ABNORMAL LOW (ref 3.87–5.11)
RDW: 15 % (ref 11.5–15.5)
WBC: 13.3 10*3/uL — ABNORMAL HIGH (ref 4.0–10.5)
nRBC: 0 % (ref 0.0–0.2)

## 2018-11-29 NOTE — Lactation Note (Signed)
This note was copied from a baby's chart. Lactation Consultation Note  Patient Name: Eileen Buck [id number] Date: 11/29/2018 Reason for consult: Follow-up assessment;1st time breastfeeding;Primapara;Term(maternal htn)  [id number] - 7001 - I visited [name] to assist with breast feeding. She states that baby "Eileen Buck" had a bottle just prior to my entry. Mom has been putting baby to breast using a nipple shield. She is injecting formula into the shield with a curved tip syringe. She states that [employer] with the shield. I offered to assist with a breast feeding today, and she stated that she thinks she can do it okay without further assistance today.  Mom and dad have been supplementing baby with formula. I reviewed the supplementation guidelines. I also showed mom and dad how to pace bottle feed and explained the rationale for paced feeding. I also suggested mom and dad could cup feed as an alternative.  Mom is a P1. Her original plan was to exclusively breast feed. We discussed typical feeding patterns in the first days of breast feeding, and I shared our community breast feeding resources. Mom is interested in a follow up call from our OP coordinator, and I agreed to put in a referral.  I checked mom's nipples. I noticed edema and flat nipples. Mom has not begun to use her breast shells, but she has them, and she plans to start using them today now that her IVs have been removed.  Mom states that she's been pumping using the initiation setting for 15 minutes every 4-5 hours. I encouraged mom to increase pumping frequency to every three hours. We discussed supply and demand and what to expect with pumping in the first few days.  Ms. Cahn verbalized understanding.   The plan today is to offer the breast with feeding cues at a minimum of every three hours. She is to breast feed first and then pump following. She should provide baby with any expressed breast milk she collects (we discussed  this). Whomever feeds baby formula can use a cup, or if using a bottle, should pace bottle feed.  Maternal Data Formula Feeding for Exclusion: No Has patient been taught Hand Expression?: Yes Does the patient have breastfeeding experience prior to this delivery?: No  Feeding Feeding Type: Bottle Fed - Formula Nipple Type: Slow - flow   Interventions Interventions: Breast feeding basics reviewed;Pre-pump if needed;Hand express(practice with NS; supplementation guidelines; paced feeding)  Lactation Tools Discussed/Used Pump Review: Setup, frequency, and cleaning   Consult Status Consult Status: Follow-up Date: 11/30/18 Follow-up type: In-patient    Lenore Manner 11/29/2018, 10:15 AM

## 2018-11-29 NOTE — Progress Notes (Signed)
Spoke to Dr. Ouida Sills regarding vitals. Confirmed he wanted bp q 4 until discharge and strict I&O's q 24 hrs post mag

## 2018-11-29 NOTE — Progress Notes (Signed)
CSW received consult for hx of Anxiety and Depression.  CSW met with MOB to offer support and complete assessment.     Upon entering the room CSW observed that curtain was closed. CSW asked permission to proceed into the room-MOB agreeable. CSW introduced role as well as congratulated MOB on the birth of infant. CSW was advised that FOB was with MOB in the room. CSW advised MOB of the HIPPA policy and was advised that FOB could stay per MOB. CSW understanding and proceeded with assessing MOB.   CSW advised MOB of the reason for the visit. MOB expressed that she was diagnosed with anxiety at the age of 14. MOB denies ever being on medications or in therapy. MOB expressed that she hasn't been feeling any symptoms related anxiety. MOB declined therapy resources at this time as MOB expressed sh eis feeling fine. CSW assessed MOB for safety and MOB reports that she is not SI or HI. MOB scored 0 on Edinburgh Depression Screen with no concerns to CSW at this time.   CSW provided education regarding the baby blues period vs. perinatal mood disorders, discussed treatment and gave resources for mental health follow up if concerns arise.  CSW recommends self-evaluation during the postpartum time period using the New Mom Checklist from Postpartum Progress and encouraged MOB to contact a medical professional if symptoms are noted at any time.   CSW provided review of Sudden Infant Death Syndrome (SIDS) precautions.   CSW identifies no further need for intervention and no barriers to discharge at this time.    Sherese Heyward S. Humzah Harty, MSW, LCSW-A [facility] [phone]  

## 2018-11-29 NOTE — Anesthesia Postprocedure Evaluation (Signed)
Anesthesia Post Note  Patient: Soil scientist  Procedure(s) Performed: AN AD Remy     Patient location during evaluation: Mother Baby Anesthesia Type: Epidural Level of consciousness: awake and alert and oriented Pain management: satisfactory to patient Vital Signs Assessment: post-procedure vital signs reviewed and stable Respiratory status: spontaneous breathing and nonlabored ventilation Cardiovascular status: stable Postop Assessment: no headache, no backache, no signs of nausea or vomiting, adequate PO intake, patient able to bend at knees, no apparent nausea or vomiting and able to ambulate (patient up walking) Anesthetic complications: no    Last Vitals:  Vitals:   11/29/18 0306 11/29/18 0750  BP: 121/76 (!) 143/90  Pulse: 93 91  Resp: 20 20  Temp: 36.6 C 36.6 C  SpO2:  97%    Last Pain:  Vitals:   11/29/18 0750  TempSrc: Oral  PainSc:    Pain Goal: Patients Stated Pain Goal: 0 (11/28/18 0308)                 Willa Rough

## 2018-11-29 NOTE — Progress Notes (Signed)
PPD#1  Pt is stable and doing well. She states that she noticed a small rash, non puritic below and behind right ear.. Lochia-mild B/P- some slightly elevated, Urine- no diuresis yet IMP/ rash- unsure what it is, Will have derm see pt if persists         B/P-improving Plan/ Will stop mag

## 2018-11-30 MED ORDER — MEASLES, MUMPS & RUBELLA VAC IJ SOLR
0.5000 mL | Freq: Once | INTRAMUSCULAR | Status: AC
  Administered 2018-11-30: 14:00:00 0.5 mL via SUBCUTANEOUS

## 2018-11-30 MED ORDER — IBUPROFEN 600 MG PO TABS: 600.0000 mg | ORAL_TABLET | Freq: Four times a day (QID) | ORAL | 0 refills | Status: DC

## 2018-11-30 NOTE — Progress Notes (Signed)
PPD#2 Pt without complaints. Lochia - mild VS- improved. On no HTN meds IMP/ Doing well Plan/ Will discharge, Pt has B/P cuff at home and will monitor b/p

## 2018-11-30 NOTE — Lactation Note (Signed)
This note was copied from a baby's chart. Lactation Consultation Note: Mother packed and ready for discharge when I arrived in the room. Mother was ask if she felt that she had enough assistance while during her hospital stay. Mother reports yes. She does plan to see LC as OP. Reviewed treatment and prevention of engorgement.  Advised to continue to off infant breast before bottle with all feeding cues. Mother to feed infant 8-12 times or more in 24 hours. Mother to allow for cluster feeding .   Mother is aware of all services at [facility] for breastfeeding questions or concerns.   Patient Name: Eileen Buck [id number] Date: 11/30/2018 Reason for consult: Follow-up assessment   Maternal Data    Feeding Feeding Type: (mother declined assist with latch at this time. going home)  [facility] Score                   Interventions Interventions: Breast massage;DEBP  Lactation Tools Discussed/Used     Consult Status Consult Status: Complete    Darla Lesches 11/30/2018, 11:36 AM

## 2018-11-30 NOTE — Discharge Summary (Signed)
Obstetric Discharge Summary Reason for Admission: induction of labor Prenatal Procedures: NST and ultrasound Intrapartum Procedures: spontaneous vaginal delivery Postpartum Procedures: none Complications-Operative and Postpartum: shoulder dystocia Hemoglobin  Date Value Ref Range Status  11/29/2018 10.9 (L) 12.0 - 15.0 g/dL Final   HCT  Date Value Ref Range Status  11/29/2018 33.5 (L) 36.0 - 46.0 % Final    Physical Exam:  General: alert and cooperative Lochia: appropriate   Discharge Diagnoses: Chronic HTN and shoulder dystocia  Discharge Information: Date: 11/30/2018 Activity: pelvic rest Diet: routine Medications: PNV and Ibuprofen Condition: stable Instructions: refer to practice specific booklet Discharge to: home Follow-up Information    Jerelyn Charles, MD. Schedule an appointment as soon as possible for a visit in 1 month(s).   Specialty:  Obstetrics Contact information: [address] [place] [address] [phone]           Newborn Data: Live born female  Birth Weight: 7 lb 13.6 oz (3560 g) APGAR: 6, 8  Newborn Delivery   Time head delivered:  11/28/2018 06:27:00 Birth date/time:  11/28/2018 06:29:00 Delivery type:  Vaginal, Spontaneous     Home with mother.  ANDERSON,MARK E 11/30/2018, 10:44 AM

## 2020-07-07 ENCOUNTER — Ambulatory Visit (INDEPENDENT_AMBULATORY_CARE_PROVIDER_SITE_OTHER): Payer: Medicaid Other | Admitting: Cardiovascular Disease

## 2020-07-07 ENCOUNTER — Encounter: Payer: Self-pay | Admitting: Cardiovascular Disease

## 2020-07-07 VITALS — BP 144/100 | HR 85 | Ht 67.0 in | Wt 255.0 lb

## 2020-07-07 DIAGNOSIS — R072 Precordial pain: Secondary | ICD-10-CM | POA: Diagnosis not present

## 2020-07-07 DIAGNOSIS — I159 Secondary hypertension, unspecified: Secondary | ICD-10-CM | POA: Diagnosis not present

## 2020-07-07 MED ORDER — AMLODIPINE BESYLATE 5 MG PO TABS: 5.0000 mg | ORAL_TABLET | Freq: Every day | ORAL | 3 refills | Status: DC

## 2020-07-07 NOTE — Progress Notes (Signed)
Cardiology Office Note:    Date:  07/07/2020   ID:  Meredith Mody, DOB 1995/09/23, MRN [id number]  PCP:  Jettie Booze, NP  [facility] Cardiologist:  Sanda Klein, MD  [place] Electrophysiologist:  None   Referring MD: Jettie Booze, NP   Chief Complaint  Patient presents with  . Chest Pain    History of Present Illness:    Eileen Buck is a 25 y.o. female with a hx of HTN presenting today with complaints of 3 days of unrelenting chest discomfort.  She describes the chest pain as sharp and throbbing located in the center of her chest and radiating through to her back.  Although she reports that there is no certain position that makes the pain worse, she has noticed that any type of motion of upper chest seems to worsen it.  The pain is not worsened by coughing or taking deep breaths and is not worsened by walking or other activities.  She has not had fever or chills or cough.  She has not had any known contacts with COVID-positive patients.  Her 49-monthold daughter has had a cough for several days and had a chest x-ray which was normal and a negative COVID test recently.  Ibuprofen has not helped her chest complaints and she took 1 omeprazole tablet a couple of nights ago, also without improvement.  She has not had any leg edema or pain or discoloration.  She has not had any recent injuries, surgeries or immobilization.  She had high blood pressure before her pregnancy, when she was only in her early 275s but paradoxically her blood pressure improved during pregnancy and she has not been taking any antihypertensive medications for a couple of years.  She previously did well with hydrochlorothiazide.  She does not have diabetes or further chronic medical conditions.  Her father passed away at a young age (early 452s from a "double enlarged heart".  Her mother was recently diagnosed with pulmonary embolism at age 27 without clear precipitant) she had knee replacement  surgery in the summer, but developed DVT/PE almost 6 months later.  There is a strong family history of diabetes mellitus in all 4 of Eileen Buck's grandparents.  There is no known family history of coronary disease, aortic aneurysm or sudden cardiac death.  Past Medical History:  Diagnosis Date  . Anxiety    xanax in past  . Headache   . Hypertension   . Nausea   . Weakness     Past Surgical History:  Procedure Laterality Date  . ANKLE SURGERY  04/2005  . APPENDECTOMY  09/16/11  . LAPAROSCOPIC APPENDECTOMY  09/16/2011   Procedure: APPENDECTOMY LAPAROSCOPIC;  Surgeon: TEarnstine Regal MD;  Location: [facility];  Service: General;  Laterality: N/A;  . NASAL SINUS SURGERY  08/2010    Current Medications: Current Meds  Medication Sig  . amLODipine (NORVASC) 5 MG tablet Take 1 tablet (5 mg total) by mouth daily.  .Marland Kitchenibuprofen (ADVIL) 600 MG tablet Take 1 tablet (600 mg total) by mouth every 6 (six) hours.  . Multiple Vitamins-Minerals (MULTIVITAMIN WOMEN) TABS Take by mouth.     Allergies:   Patient has no known allergies.   Social History   Socioeconomic History  . Marital status: Single    Spouse name: Not on file  . Number of children: Not on file  . Years of education: Not on file  . Highest education level: Not on file  Occupational History  . Not on  file  Tobacco Use  . Smoking status: Never Smoker  . Smokeless tobacco: Never Used  Vaping Use  . Vaping Use: Never used  Substance and Sexual Activity  . Alcohol use: No  . Drug use: No  . Sexual activity: Yes    Birth control/protection: None  Other Topics Concern  . Not on file  Social History Narrative  . Not on file   Social Determinants of Health   Financial Resource Strain: Not on file  Food Insecurity: Not on file  Transportation Needs: Not on file  Physical Activity: Not on file  Stress: Not on file  Social Connections: Not on file     Family History: The patient's family history includes Cancer in her  paternal grandmother; Heart disease in her father; Hypertension in her mother.  DVT/PE in her mother.  ROS:   Please see the history of present illness.    All other systems reviewed and are negative.  EKGs/Labs/Other Studies Reviewed:    The following studies were reviewed today: n/a  EKG:  EKG is ordered today.  The ekg ordered today demonstrates normal sinus rhythm, normal tracing  Recent Labs: No results found for requested labs within last 8760 hours.  Recent Lipid Panel No results found for: CHOL, TRIG, HDL, CHOLHDL, VLDL, LDLCALC, LDLDIRECT   Risk Assessment/Calculations:       Physical Exam:    VS:  BP (!) 144/100   Pulse 85   Ht _0  (1.702 m)   Wt 255 lb (115.7 kg)   BMI 39.94 kg/m     Wt Readings from Last 3 Encounters:  07/07/20 255 lb (115.7 kg)  11/27/18 234 lb 12.8 oz (106.5 kg)  11/18/18 286 lb 7 oz (129.9 kg)     GEN: Borderline morbidly obese, well nourished, well developed in no acute distress HEENT: Normal NECK: No JVD; No carotid bruits LYMPHATICS: No lymphadenopathy CARDIAC: RRR, no murmurs, rubs, gallops RESPIRATORY:  Clear to auscultation without rales, wheezing or rhonchi  ABDOMEN: Soft, non-tender, non-distended MUSCULOSKELETAL:  No edema; No deformity  SKIN: Warm and dry NEUROLOGIC:  Alert and oriented x 3 PSYCHIATRIC:  Normal affect   ASSESSMENT:    1. Precordial pain   2. Secondary hypertension    PLAN:    In order of problems listed above:  1. Chest pain: This has been constant and does not have the typical features of either pleuritic or anginal chest pain.  Nevertheless she has risk factors for pulmonary embolism (family history of unprovoked PE and severe obesity).  It is reasonable to at least check a D-dimer.  Pericarditis should also be considered in view of the ongoing viral pandemic and her daughter's recent viral respiratory illness, even though the patient herself has not had fever or typical viral syndrome.  We will  check an echocardiogram and erythrocyte sedimentation rate.  Aortic pathology appears unlikely, but should be considered in view of her elevated blood pressure.  She does not have physical findings suggestive of coarctation.  We will get a glimpse of her ascending aorta on echocardiogram, but if chest pain continues may have to do a CT angiogram to.  She does not have typical GI symptoms, but I suggested a trial of antacids to see if this helps.  If these provide relief, would recommend a short course of treatment with H2 blocker and or PPI. 2. HTN: Remarkably young at onset of hypertension.  Recheck metabolic parameters especially potassium and creatinine, UA.  Coarctation considered but not  supported by physical exam.  Consider renal artery stenosis due to fibromuscular dysplasia.  May need to check aldosterone levels, will avoid RAAS inhibitors and diuretics for now.  Start amlodipine 5 mg once daily.        Medication Adjustments/Labs and Tests Ordered: Current medicines are reviewed at length with the patient today.  Concerns regarding medicines are outlined above.  Orders Placed This Encounter  Procedures  . CBC  . Basic metabolic panel  . Sed Rate (ESR)  . D-Dimer, Quantitative  . Urinalysis  . EKG 12-Lead  . ECHOCARDIOGRAM COMPLETE   Meds ordered this encounter  Medications  . amLODipine (NORVASC) 5 MG tablet    Sig: Take 1 tablet (5 mg total) by mouth daily.    Dispense:  90 tablet    Refill:  3    Patient Instructions  Medication Instructions:  START Amlodipine 5 mg once daily  *If you need a refill on your cardiac medications before your next appointment, please call your pharmacy*   Lab Work: Your provider would like for you to have the following labs today: CBC, BMET, Sed Rate and D Dimer  If you have labs (blood work) drawn today and your tests are completely normal, you will receive your results only by: Marland Kitchen MyChart Message (if you have MyChart) OR . A paper copy  in the mail If you have any lab test that is abnormal or we need to change your treatment, we will call you to review the results.   Testing/Procedures: Your physician has requested that you have an echocardiogram. Echocardiography is a painless test that uses sound waves to create images of your heart. It provides your doctor with information about the size and shape of your heart and how well your heart's chambers and valves are working. You may receive an ultrasound enhancing agent through an IV if needed to better visualize your heart during the echo.This procedure takes approximately one hour. There are no restrictions for this procedure. This will take place at the [address].     Follow-Up: At [facility], you and your health needs are our priority.  As part of our continuing mission to provide you with exceptional heart care, we have created designated Provider Care Teams.  These Care Teams include your primary Cardiologist (physician) and Advanced Practice Providers (APPs -  Physician Assistants and Nurse Practitioners) who all work together to provide you with the care you need, when you need it.  We recommend signing up for the patient portal called "MyChart".  Sign up information is provided on this After Visit Summary.  MyChart is used to connect with patients for Virtual Visits (Telemedicine).  Patients are able to view lab/test results, encounter notes, upcoming appointments, etc.  Non-urgent messages can be sent to your provider as well.   To learn more about what you can do with MyChart, go to [email or web address].    Your next appointment:   Follow up next week with Dr. Sallyanne Kuster.      Signed, Sanda Klein, MD  07/07/2020 10:44 AM    [place]

## 2020-07-07 NOTE — Patient Instructions (Signed)
Medication Instructions:  START Amlodipine 5 mg once daily  *If you need a refill on your cardiac medications before your next appointment, please call your pharmacy*   Lab Work: Your provider would like for you to have the following labs today: CBC, BMET, Sed Rate and D Dimer  If you have labs (blood work) drawn today and your tests are completely normal, you will receive your results only by:  MyChart Message (if you have MyChart) OR  A paper copy in the mail If you have any lab test that is abnormal or we need to change your treatment, we will call you to review the results.   Testing/Procedures: Your physician has requested that you have an echocardiogram. Echocardiography is a painless test that uses sound waves to create images of your heart. It provides your doctor with information about the size and shape of your heart and how well your hearts chambers and valves are working. You may receive an ultrasound enhancing agent through an IV if needed to better visualize your heart during the echo.This procedure takes approximately one hour. There are no restrictions for this procedure. This will take place at the [address].     Follow-Up: At [facility], you and your health needs are our priority.  As part of our continuing mission to provide you with exceptional heart care, we have created designated Provider Care Teams.  These Care Teams include your primary Cardiologist (physician) and Advanced Practice Providers (APPs -  Physician Assistants and Nurse Practitioners) who all work together to provide you with the care you need, when you need it.  We recommend signing up for the patient portal called "MyChart".  Sign up information is provided on this After Visit Summary.  MyChart is used to connect with patients for Virtual Visits (Telemedicine).  Patients are able to view lab/test results, encounter notes, upcoming appointments, etc.  Non-urgent messages can be  sent to your provider as well.   To learn more about what you can do with MyChart, go to [email or web address].    Your next appointment:   Follow up next week with Dr. Royann Shivers.

## 2020-07-08 ENCOUNTER — Other Ambulatory Visit: Payer: Self-pay

## 2020-07-08 ENCOUNTER — Other Ambulatory Visit: Payer: Self-pay | Admitting: *Deleted

## 2020-07-08 ENCOUNTER — Ambulatory Visit (HOSPITAL_COMMUNITY)
Admission: RE | Admit: 2020-07-08 | Discharge: 2020-07-08 | Disposition: A | Discharge status: Home / Self Care | Payer: Medicaid Other | Source: Ambulatory Visit | Attending: Cardiovascular Disease | Admitting: Cardiovascular Disease

## 2020-07-08 DIAGNOSIS — I159 Secondary hypertension, unspecified: Secondary | ICD-10-CM

## 2020-07-08 DIAGNOSIS — R072 Precordial pain: Secondary | ICD-10-CM | POA: Diagnosis not present

## 2020-07-08 LAB — BASIC METABOLIC PANEL
BUN/Creatinine Ratio: 21 (ref 9–23)
BUN: 12 mg/dL (ref 6–20)
CO2: 21 mmol/L (ref 20–29)
Calcium: 9.6 mg/dL (ref 8.7–10.2)
Chloride: 104 mmol/L (ref 96–106)
Creatinine, Ser: 0.57 mg/dL (ref 0.57–1.00)
GFR calc Af Amer: 148 mL/min/{1.73_m2} (ref >59)
GFR calc non Af Amer: 128 mL/min/{1.73_m2} (ref >59)
Glucose: 83 mg/dL (ref 65–99)
Potassium: 4.5 mmol/L (ref 3.5–5.2)
Sodium: 141 mmol/L (ref 134–144)

## 2020-07-08 LAB — ECHOCARDIOGRAM COMPLETE
AR max vel: 1.76 cm2
AV Area VTI: 2.14 cm2
AV Area mean vel: 2 cm2
AV Mean grad: 4.6 mmHg
AV Peak grad: 8.3 mmHg
Ao pk vel: 1.44 m/s
S' Lateral: 3.2 cm

## 2020-07-08 LAB — URINALYSIS
Bilirubin, UA: NEGATIVE
Glucose, UA: NEGATIVE
Ketones, UA: NEGATIVE
Nitrite, UA: NEGATIVE
RBC, UA: NEGATIVE
Specific Gravity, UA: 1.028 (ref 1.005–1.030)
Urobilinogen, Ur: 0.2 mg/dL (ref 0.2–1.0)
pH, UA: 5.5 (ref 5.0–7.5)

## 2020-07-08 LAB — CBC
Hematocrit: 47.9 % — ABNORMAL HIGH (ref 34.0–46.6)
Hemoglobin: 15.8 g/dL (ref 11.1–15.9)
MCH: 28.7 pg (ref 26.6–33.0)
MCHC: 33 g/dL (ref 31.5–35.7)
MCV: 87 fL (ref 79–97)
Platelets: 242 10*3/uL (ref 150–450)
RBC: 5.51 x10E6/uL — ABNORMAL HIGH (ref 3.77–5.28)
RDW: 12.3 % (ref 11.7–15.4)
WBC: 11.3 10*3/uL — ABNORMAL HIGH (ref 3.4–10.8)

## 2020-07-08 LAB — SEDIMENTATION RATE: Sed Rate: 33 mm/hr — ABNORMAL HIGH (ref 0–32)

## 2020-07-08 LAB — D-DIMER, QUANTITATIVE: D-DIMER: 0.25 mg/L FEU (ref 0.00–0.49)

## 2020-07-08 MED ORDER — PERFLUTREN LIPID MICROSPHERE: 1.0000 mL | INTRAVENOUS | Status: AC | PRN

## 2020-07-08 NOTE — Progress Notes (Signed)
*  PRELIMINARY RESULTS* Echocardiogram 2D Echocardiogram has been performed with [facility].  Stacey Drain 07/08/2020, 2:43 PM

## 2020-07-14 ENCOUNTER — Telehealth (INDEPENDENT_AMBULATORY_CARE_PROVIDER_SITE_OTHER): Payer: Medicaid Other | Admitting: Cardiovascular Disease

## 2020-07-14 ENCOUNTER — Encounter: Payer: Self-pay | Admitting: Cardiovascular Disease

## 2020-07-14 VITALS — BP 143/114 | Ht 67.0 in | Wt 255.0 lb

## 2020-07-14 DIAGNOSIS — R072 Precordial pain: Secondary | ICD-10-CM

## 2020-07-14 DIAGNOSIS — I159 Secondary hypertension, unspecified: Secondary | ICD-10-CM

## 2020-07-14 DIAGNOSIS — R82998 Other abnormal findings in urine: Secondary | ICD-10-CM

## 2020-07-14 MED ORDER — CARVEDILOL 6.25 MG PO TABS: 6.2500 mg | ORAL_TABLET | Freq: Two times a day (BID) | ORAL | 3 refills | Status: DC

## 2020-07-14 NOTE — Progress Notes (Signed)
Virtual Visit via Video Note   This visit type was conducted due to national recommendations for restrictions regarding the COVID-19 Pandemic (e.g. social distancing) in an effort to limit this patient's exposure and mitigate transmission in our community.  Due to her co-morbid illnesses, this patient is at least at moderate risk for complications without adequate follow up.  This format is felt to be most appropriate for this patient at this time.  All issues noted in this document were discussed and addressed.  A limited physical exam was performed with this format.  Please refer to the patient's chart for her consent to telehealth for [facility].    Date:  07/14/2020   ID:  Eileen Buck, DOB 08/03/1995, MRN [id number] The patient was identified using 2 identifiers.  Patient Location: Home Provider Location: Office/Clinic  PCP:  April MansonWhite, Marsha L, NP  Cardiologist:  Thurmon FairMihai Tanaka Gillen, MD  Electrophysiologist:  None   Evaluation Performed:  Follow-Up Visit  Chief Complaint  Patient presents with  . Follow-up    1 week.  Marland Kitchen. Headache    Continuous.    History of Present Illness:    Eileen Buck is a 25 y.o. female with a hx of HTN, initially presenting with complaints of chest discomfort about a week ago.    After starting treatment with amlodipine her chest discomfort has improved, but she has had a constant headache for the last 3 days.  Her blood pressure remains very high at 143/114 today.  She is also mildly tachycardic at 108 bpm today.  Labs showed normal renal function and potassium level.  Echocardiogram showed normal findings including the absence of left ventricular hypertrophy or pericardial effusion.  Left ventricular systolic and diastolic parameters were normal.  There was no evidence of aortic coarctation or aortic aneurysm.  D-dimer was normal as was her erythrocyte sedimentation rate.  She had a borderline elevated WBC and mild abnormalities on her urinalysis  (leukocyturia), but no symptoms of urinary tract infection.  There was no hematuria.  Other than the headache she has no other neurological complaints.  She is not dyspneic.  She does not have lower extremity edema.  She is not taking NSAIDs.  She has not had problems with leg edema.  She had high blood pressure before her pregnancy, when she was only in her early 220s, but paradoxically her blood pressure improved during pregnancy and she has not been taking any antihypertensive medications for a couple of years.  She previously did well with hydrochlorothiazide.  She does not have diabetes or further chronic medical conditions.  Her father passed away at a young age (early 2640s) from a "double enlarged heart".  Her mother was recently diagnosed with pulmonary embolism at age 27, without clear precipitant) she had knee replacement surgery in the summer, but developed DVT/PE almost 6 months later.  There is a strong family history of diabetes mellitus in all 4 of [name]'s grandparents.  There is no known family history of coronary disease, aortic aneurysm or sudden cardiac death.  Past Medical History:  Diagnosis Date  . Anxiety    xanax in past  . Headache   . Hypertension   . Nausea   . Weakness     Past Surgical History:  Procedure Laterality Date  . ANKLE SURGERY  04/2005  . APPENDECTOMY  09/16/11  . LAPAROSCOPIC APPENDECTOMY  09/16/2011   Procedure: APPENDECTOMY LAPAROSCOPIC;  Surgeon: Velora Hecklerodd M Gerkin, MD;  Location: [facility];  Service: General;  Laterality: N/A;  .  NASAL SINUS SURGERY  08/2010    Current Medications: Current Meds  Medication Sig  . amLODipine (NORVASC) 5 MG tablet Take 1 tablet (5 mg total) by mouth daily.  . carvedilol (COREG) 6.25 MG tablet Take 1 tablet (6.25 mg total) by mouth 2 (two) times daily.  Marland Kitchen ibuprofen (ADVIL) 600 MG tablet Take 1 tablet (600 mg total) by mouth every 6 (six) hours.  . Multiple Vitamins-Minerals (MULTIVITAMIN WOMEN) TABS Take by mouth.      Allergies:   Patient has no known allergies.   Social History   Socioeconomic History  . Marital status: Single    Spouse name: Not on file  . Number of children: Not on file  . Years of education: Not on file  . Highest education level: Not on file  Occupational History  . Not on file  Tobacco Use  . Smoking status: Never Smoker  . Smokeless tobacco: Never Used  Vaping Use  . Vaping Use: Never used  Substance and Sexual Activity  . Alcohol use: No  . Drug use: No  . Sexual activity: Yes    Birth control/protection: None  Other Topics Concern  . Not on file  Social History Narrative  . Not on file   Social Determinants of Health   Financial Resource Strain: Not on file  Food Insecurity: Not on file  Transportation Needs: Not on file  Physical Activity: Not on file  Stress: Not on file  Social Connections: Not on file     Family History: The patient's family history includes Cancer in her paternal grandmother; Heart disease in her father; Hypertension in her mother.  DVT/PE in her mother.  ROS:   Please see the history of present illness.    All other systems are reviewed and are negative.   EKGs/Labs/Other Studies Reviewed:    The following studies were reviewed today: n/a  EKG:  EKG is not ordered today.  The ekg ordered at her recent office visit was a completely normal tracing in sinus rhythm.  Recent Labs: 07/07/2020: BUN 12; Creatinine, Ser 0.57; Hemoglobin 15.8; Platelets 242; Potassium 4.5; Sodium 141  Recent Lipid Panel No results found for: CHOL, TRIG, HDL, CHOLHDL, VLDL, LDLCALC, LDLDIRECT   Risk Assessment/Calculations:       Physical Exam:    VS:  BP (!) 143/114   Ht 5\' 7"  (1.702 m)   Wt 255 lb (115.7 kg)   BMI 39.94 kg/m     Wt Readings from Last 3 Encounters:  07/14/20 255 lb (115.7 kg)  07/07/20 255 lb (115.7 kg)  11/27/18 234 lb 12.8 oz (106.5 kg)     General: In no acute distress HEENT: No evidence of exophthalmos, lid  lag or pupillary abnormalities Neck: No evidence of jugular venous distention is seen Respiratory: No evidence of respiratory difficulty, normal breathing Cardiovascular: No edema Neurological: No evidence of facial asymmetry, tremor, normal speech without slurring Psych: Normal mood and affect  ASSESSMENT:    1. Precordial pain   2. Secondary hypertension   3. Leukocytes in urine    PLAN:    In order of problems listed above:  1. Chest pain: This is improving after starting antihypertensive medications.  There was no visible ascending aortic aneurysm and no evidence of coarctation by transthoracic echo.  The erythrocyte sedimentation rate and D-dimer were normal.  We will hold off on additional evaluation with imaging studies for the time being.  I suspect it may completely resolved with control of her hypertension.  2. HTN: Remarkably young age of onset and severe elevation of diastolic blood pressure.  Almost certainly will require multiple agents for control.  Check for renal artery stenosis and check plasma renin aldosterone ratio.  Since she is mildly tachycardic after starting amlodipine we will also place on carvedilol.  Normal baseline potassium and renal parameters.  After we get her labs we can add diuretic and/or ARB. 3. Leukocyturia: No symptoms of urinary tract infection and no evidence of hematuria.  Repeat urinalysis.  No need to give antibiotics unless we confirm symptomatic UTI.  Makes me wonder whether there could be an active nephritis causing her hypertension.  We will repeat the urinalysis.     Total time of video visit 21 minutes  Medication Adjustments/Labs and Tests Ordered: Current medicines are reviewed at length with the patient today.  Concerns regarding medicines are outlined above.  Orders Placed This Encounter  Procedures  . Aldosterone + renin activity w/ ratio  . VAS US RENAL ARTERY DUPLEX   Meds ordered this encounter  Medications  . carvedilol  (COREG) 6.25 MG tablet    Sig: Take 1 tablet (6.25 mg total) by mouth 2 (two) times daily.    Dispense:  180 tablet    Refill:  3    Patient Instructions  Medication Instructions:  START Carvedilol 6.125 mg twice daily  *If you need a refill on your cardiac medications before your next appointment, please call your pharmacy*   Lab Work: Your provider would like for you to have the following labs today: Renin Aldosterone Ratio  If you have labs (blood work) drawn today and your tests are completely normal, you will receive your results only by: Marland Kitchen MyChart Message (if you have MyChart) OR . A paper copy in the mail If you have any lab test that is abnormal or we need to change your treatment, we will call you to review the results.   Testing/Procedures: Your physician has requested that you have a renal artery duplex. During this test, an ultrasound is used to evaluate blood flow to the kidneys. Take your medications as you usually do. This will take place at [address] [facility], [address].   No food after 11PM the night before.  Water is OK. (Don't drink liquids if you have been instructed not to for ANOTHER test).  Take two Extra-Strength Gas-X capsules at bedtime the night before test.   Take an additional two Extra-Strength Gas-X capsules three (3) hours before the test or first thing in the morning.    Avoid foods that produce bowel gas, for 24 hours prior to exam (see below).    No breakfast, no chewing gum, no smoking or carbonated beverages.  Patient may take morning medications with water.  Come in for test at least 15 minutes early to register.    Follow-Up: At [facility], you and your health needs are our priority.  As part of our continuing mission to provide you with exceptional heart care, we have created designated Provider Care Teams.  These Care Teams include your primary Cardiologist (physician) and Advanced Practice Providers (APPs -  Physician Assistants  and Nurse Practitioners) who all work together to provide you with the care you need, when you need it.  We recommend signing up for the patient portal called "MyChart".  Sign up information is provided on this After Visit Summary.  MyChart is used to connect with patients for Virtual Visits (Telemedicine).  Patients are able to view lab/test results,  encounter notes, upcoming appointments, etc.  Non-urgent messages can be sent to your provider as well.   To learn more about what you can do with MyChart, go to [email or web address].    Your next appointment:   Follow up with Dr. Royann Shivers in 3 weeks  Other Instructions Send [place] blood pressure readings on Monday.      Signed, Thurmon Fair, MD  07/14/2020 5:40 PM    [facility]

## 2020-07-14 NOTE — Patient Instructions (Signed)
Medication Instructions:  START Carvedilol 6.125 mg twice daily  *If you need a refill on your cardiac medications before your next appointment, please call your pharmacy*   Lab Work: Your provider would like for you to have the following labs today: Renin Aldosterone Ratio  If you have labs (blood work) drawn today and your tests are completely normal, you will receive your results only by: Marland Kitchen MyChart Message (if you have MyChart) OR . A paper copy in the mail If you have any lab test that is abnormal or we need to change your treatment, we will call you to review the results.   Testing/Procedures: Your physician has requested that you have a renal artery duplex. During this test, an ultrasound is used to evaluate blood flow to the kidneys. Take your medications as you usually do. This will take place at [address] [facility], [address].   No food after 11PM the night before.  Water is OK. (Don't drink liquids if you have been instructed not to for ANOTHER test).  Take two Extra-Strength Gas-X capsules at bedtime the night before test.   Take an additional two Extra-Strength Gas-X capsules three (3) hours before the test or first thing in the morning.    Avoid foods that produce bowel gas, for 24 hours prior to exam (see below).    No breakfast, no chewing gum, no smoking or carbonated beverages.  Patient may take morning medications with water.  Come in for test at least 15 minutes early to register.    Follow-Up: At [facility], you and your health needs are our priority.  As part of our continuing mission to provide you with exceptional heart care, we have created designated Provider Care Teams.  These Care Teams include your primary Cardiologist (physician) and Advanced Practice Providers (APPs -  Physician Assistants and Nurse Practitioners) who all work together to provide you with the care you need, when you need it.  We recommend signing up for the patient portal called  "MyChart".  Sign up information is provided on this After Visit Summary.  MyChart is used to connect with patients for Virtual Visits (Telemedicine).  Patients are able to view lab/test results, encounter notes, upcoming appointments, etc.  Non-urgent messages can be sent to your provider as well.   To learn more about what you can do with MyChart, go to [email or web address].    Your next appointment:   Follow up with Dr. Royann Shivers in 3 weeks  Other Instructions Send [place] blood pressure readings on Monday.

## 2020-07-18 ENCOUNTER — Other Ambulatory Visit: Payer: Self-pay | Admitting: *Deleted

## 2020-07-18 DIAGNOSIS — I159 Secondary hypertension, unspecified: Secondary | ICD-10-CM

## 2020-07-23 LAB — URINALYSIS

## 2020-07-25 ENCOUNTER — Other Ambulatory Visit: Payer: Self-pay | Admitting: *Deleted

## 2020-07-25 DIAGNOSIS — I159 Secondary hypertension, unspecified: Secondary | ICD-10-CM

## 2020-07-27 ENCOUNTER — Ambulatory Visit (HOSPITAL_COMMUNITY)
Admission: RE | Admit: 2020-07-27 | Discharge: 2020-07-27 | Disposition: A | Discharge status: Home / Self Care | Payer: Medicaid Other | Source: Ambulatory Visit | Attending: Cardiovascular Disease | Admitting: Cardiovascular Disease

## 2020-07-27 ENCOUNTER — Other Ambulatory Visit: Payer: Self-pay

## 2020-07-27 DIAGNOSIS — I159 Secondary hypertension, unspecified: Secondary | ICD-10-CM | POA: Insufficient documentation

## 2020-07-27 LAB — ALDOSTERONE + RENIN ACTIVITY W/ RATIO
ALDOS/RENIN RATIO: 14.6 (ref 0.0–30.0)
ALDOSTERONE: 9.2 ng/dL (ref 0.0–30.0)
Renin: 0.629 ng/mL/hr (ref 0.167–5.380)

## 2020-07-28 LAB — URINALYSIS
Bilirubin, UA: NEGATIVE
Glucose, UA: NEGATIVE
Ketones, UA: NEGATIVE
Leukocytes,UA: NEGATIVE
Nitrite, UA: NEGATIVE
Protein,UA: NEGATIVE
RBC, UA: NEGATIVE
Specific Gravity, UA: 1.017 (ref 1.005–1.030)
Urobilinogen, Ur: 0.2 mg/dL (ref 0.2–1.0)
pH, UA: 6.5 (ref 5.0–7.5)

## 2020-08-02 ENCOUNTER — Ambulatory Visit: Payer: Medicaid Other | Admitting: Cardiovascular Disease

## 2020-09-09 ENCOUNTER — Other Ambulatory Visit: Payer: Self-pay | Admitting: *Deleted

## 2020-09-09 MED ORDER — TRIAMTERENE-HCTZ 37.5-25 MG PO CAPS: 1.0000 | ORAL_CAPSULE | Freq: Every day | ORAL | 3 refills | Status: DC

## 2020-09-09 NOTE — Progress Notes (Signed)
Per Dr. Royann Shivers, DBP still running high in the mid 90s.  Mild ankle swelling from amlodipine.  Please start triamterene-HCTZ 37.5/25 once daily, send in rx. If BP gets too low, we will plan to reduce the amlodipine, which is causing some edema

## 2020-09-30 ENCOUNTER — Encounter (HOSPITAL_COMMUNITY): Payer: Medicaid Other

## 2020-10-11 ENCOUNTER — Other Ambulatory Visit: Payer: Self-pay | Admitting: *Deleted

## 2020-10-11 MED ORDER — AMLODIPINE BESYLATE 5 MG PO TABS: 5.0000 mg | ORAL_TABLET | Freq: Every day | ORAL | 3 refills | Status: DC

## 2020-11-01 ENCOUNTER — Ambulatory Visit (INDEPENDENT_AMBULATORY_CARE_PROVIDER_SITE_OTHER): Payer: Medicaid Other | Admitting: Internal Medicine

## 2020-11-01 ENCOUNTER — Emergency Department (HOSPITAL_COMMUNITY): Payer: Medicaid Other

## 2020-11-01 ENCOUNTER — Emergency Department (HOSPITAL_COMMUNITY)
Admission: EM | Admit: 2020-11-01 | Discharge: 2020-11-01 | Disposition: A | Discharge status: Home / Self Care | Payer: Medicaid Other | Attending: Emergency Medicine | Admitting: Emergency Medicine

## 2020-11-01 ENCOUNTER — Encounter (HOSPITAL_COMMUNITY): Payer: Self-pay | Admitting: Emergency Medicine

## 2020-11-01 ENCOUNTER — Other Ambulatory Visit: Payer: Self-pay

## 2020-11-01 VITALS — BP 120/80 | HR 102 | Ht 67.0 in

## 2020-11-01 DIAGNOSIS — I159 Secondary hypertension, unspecified: Secondary | ICD-10-CM | POA: Diagnosis not present

## 2020-11-01 DIAGNOSIS — I10 Essential (primary) hypertension: Secondary | ICD-10-CM | POA: Insufficient documentation

## 2020-11-01 DIAGNOSIS — R0789 Other chest pain: Secondary | ICD-10-CM

## 2020-11-01 DIAGNOSIS — R0602 Shortness of breath: Secondary | ICD-10-CM

## 2020-11-01 DIAGNOSIS — Z79899 Other long term (current) drug therapy: Secondary | ICD-10-CM | POA: Diagnosis not present

## 2020-11-01 DIAGNOSIS — R002 Palpitations: Secondary | ICD-10-CM

## 2020-11-01 DIAGNOSIS — R072 Precordial pain: Secondary | ICD-10-CM | POA: Diagnosis not present

## 2020-11-01 DIAGNOSIS — R079 Chest pain, unspecified: Secondary | ICD-10-CM | POA: Diagnosis present

## 2020-11-01 LAB — CBC WITH DIFFERENTIAL/PLATELET
Abs Immature Granulocytes: 0.04 10*3/uL (ref 0.00–0.07)
Basophils Absolute: 0.1 10*3/uL (ref 0.0–0.1)
Basophils Relative: 1 %
Eosinophils Absolute: 0.1 10*3/uL (ref 0.0–0.5)
Eosinophils Relative: 1 %
HCT: 47.5 % — ABNORMAL HIGH (ref 36.0–46.0)
Hemoglobin: 15.6 g/dL — ABNORMAL HIGH (ref 12.0–15.0)
Immature Granulocytes: 0 %
Lymphocytes Relative: 33 %
Lymphs Abs: 3.7 10*3/uL (ref 0.7–4.0)
MCH: 29.3 pg (ref 26.0–34.0)
MCHC: 32.8 g/dL (ref 30.0–36.0)
MCV: 89.1 fL (ref 80.0–100.0)
Monocytes Absolute: 0.7 10*3/uL (ref 0.1–1.0)
Monocytes Relative: 6 %
Neutro Abs: 6.6 10*3/uL (ref 1.7–7.7)
Neutrophils Relative %: 59 %
Platelets: 273 10*3/uL (ref 150–400)
RBC: 5.33 MIL/uL — ABNORMAL HIGH (ref 3.87–5.11)
RDW: 13 % (ref 11.5–15.5)
WBC: 11.2 10*3/uL — ABNORMAL HIGH (ref 4.0–10.5)
nRBC: 0 % (ref 0.0–0.2)

## 2020-11-01 LAB — COMPREHENSIVE METABOLIC PANEL
ALT: 24 U/L (ref 0–44)
AST: 16 U/L (ref 15–41)
Albumin: 4.1 g/dL (ref 3.5–5.0)
Alkaline Phosphatase: 67 U/L (ref 38–126)
Anion gap: 7 (ref 5–15)
BUN: 12 mg/dL (ref 6–20)
CO2: 25 mmol/L (ref 22–32)
Calcium: 9.2 mg/dL (ref 8.9–10.3)
Chloride: 107 mmol/L (ref 98–111)
Creatinine, Ser: 0.62 mg/dL (ref 0.44–1.00)
GFR, Estimated: >60 mL/min (ref >60)
Glucose, Bld: 97 mg/dL (ref 70–99)
Potassium: 3.9 mmol/L (ref 3.5–5.1)
Sodium: 139 mmol/L (ref 135–145)
Total Bilirubin: 0.3 mg/dL (ref 0.3–1.2)
Total Protein: 7.9 g/dL (ref 6.5–8.1)

## 2020-11-01 LAB — TROPONIN I (HIGH SENSITIVITY)
Troponin I (High Sensitivity): 2 ng/L (ref <18)
Troponin I (High Sensitivity): 3 ng/L (ref <18)

## 2020-11-01 LAB — I-STAT BETA HCG BLOOD, ED (MC, WL, AP ONLY): I-stat hCG, quantitative: <5 m[IU]/mL (ref <5)

## 2020-11-01 LAB — D-DIMER, QUANTITATIVE: D-Dimer, Quant: 0.27 ug/mL-FEU (ref 0.00–0.50)

## 2020-11-01 MED ORDER — LORAZEPAM 2 MG/ML IJ SOLN
0.5000 mg | Freq: Once | INTRAMUSCULAR | Status: AC
  Administered 2020-11-01: 0.5 mg via INTRAVENOUS
  Filled 2020-11-01: qty 1

## 2020-11-01 NOTE — Discharge Instructions (Addendum)
Follow back up with your heart doctor and continue taking your blood pressure medicine as prescribed

## 2020-11-01 NOTE — Addendum Note (Signed)
Addended by: Darene Lamer T on: 11/01/2020 09:00 AM   Modules accepted: Orders

## 2020-11-01 NOTE — Patient Instructions (Signed)
Medication Instructions:  The current medical regimen is effective;  continue present plan and medications.  *If you need a refill on your cardiac medications before your next appointment, please call your pharmacy*   Follow-Up: At [facility], you and your health needs are our priority.  As part of our continuing mission to provide you with exceptional heart care, we have created designated Provider Care Teams.  These Care Teams include your primary Cardiologist (physician) and Advanced Practice Providers (APPs -  Physician Assistants and Nurse Practitioners) who all work together to provide you with the care you need, when you need it.  We recommend signing up for the patient portal called "MyChart".  Sign up information is provided on this After Visit Summary.  MyChart is used to connect with patients for Virtual Visits (Telemedicine).  Patients are able to view lab/test results, encounter notes, upcoming appointments, etc.  Non-urgent messages can be sent to your provider as well.   To learn more about what you can do with MyChart, go to [email or web address].    Your next appointment:   Next few days following hospital visit   The format for your next appointment:   In Person  Provider:   Thurmon Fair, MD

## 2020-11-01 NOTE — Progress Notes (Signed)
Cardiology Office Note:    Date:  11/01/2020   ID:  Eileen Buck, DOB 1995/12/08, MRN [id number]  PCP:  April Manson, NP  Cardiologist:  Thurmon Fair, MD  Electrophysiologist:  None   Referring MD: April Manson, NP   Chief Complaint/Reason for Referral: Chest pain, shortness of breath  History of Present Illness:    Eileen Buck is a 25 y.o. female with a history of HTN, palpitations and chest pain here for evaluation of chest pain and shortness of breath.  Since yesterday evening, she has been having spells of a fast heart rate and shortness of breath that have been waking her up at night. Her racing heart is what wakes her up, and then she notices herself gasping for breath. After trying to stay calm and breathe, she would lie down again and fall asleep until the next episode. She notes feeling extra fatigue this past weekend. However, she also has a 2 yo child which she thinks may attribute to this.  She is currently short of breath, and has been having some intermittent, sharp chest pains that last for a few minutes at a time. She denies any palpitations, pre-syncope, syncope, or lightheadedness/dizziness. Also has no lower extremity edema, orthopnea or PND.  She had COVID in December 2020 and has since returned to baseline. She reports she has a 2 yo daughter and has had 2 miscarriages. Also, she has a history of chest discomfort and shortness of breath.  Her mother has a history of multiple DVT/PE. She believes increased disposition to thrombus runs in her family. Her father died of CAD and heart attack when he was very young, 27 yo.  Past Medical History:  Diagnosis Date  . Anxiety    xanax in past  . Headache   . Hypertension   . Nausea   . Weakness     Past Surgical History:  Procedure Laterality Date  . ANKLE SURGERY  04/2005  . APPENDECTOMY  09/16/11  . LAPAROSCOPIC APPENDECTOMY  09/16/2011   Procedure: APPENDECTOMY LAPAROSCOPIC;  Surgeon: Velora Heckler, MD;   Location: [facility];  Service: General;  Laterality: N/A;  . NASAL SINUS SURGERY  08/2010    Current Medications: Current Meds  Medication Sig  . amLODipine (NORVASC) 5 MG tablet Take 1 tablet (5 mg total) by mouth daily.  . carvedilol (COREG) 6.25 MG tablet Take 1 tablet (6.25 mg total) by mouth 2 (two) times daily.  Marland Kitchen ibuprofen (ADVIL) 600 MG tablet Take 1 tablet (600 mg total) by mouth every 6 (six) hours.  . Multiple Vitamins-Minerals (MULTIVITAMIN WOMEN) TABS Take by mouth.  . triamterene-hydrochlorothiazide (DYAZIDE) 37.5-25 MG capsule Take 1 each (1 capsule total) by mouth daily.     Allergies:   Patient has no known allergies.   Social History   Tobacco Use  . Smoking status: Never Smoker  . Smokeless tobacco: Never Used  Vaping Use  . Vaping Use: Never used  Substance Use Topics  . Alcohol use: No  . Drug use: No     Family History: The patient's family history includes Cancer in her paternal grandmother; Heart disease in her father; Hypertension in her mother.  ROS:   Please see the history of present illness.    (+) Racing heart rate (+) Shortness of Breath (+) Chest pain All other systems reviewed and are negative.  EKGs/Labs/Other Studies Reviewed:    The following studies were reviewed today:  Vas US Renal Artery Bilateral 07/27/2020: Summary:  Largest  Aortic Diameter: 1.9 cm    Renal:    Right: Normal size right kidney. Normal right Resistive Index.     Normal cortical thickness of right kidney. 1-59% stenosis of     the right renal artery. RRV flow present.  Left: Normal size of left kidney. Normal left Resistive Index.     Normal cortical thickness of the left kidney. 1-59% stenosis     of the left renal artery. LRV flow present.  Mesenteric:  Normal Celiac artery and Superior Mesenteric artery findings.    Patent IVC.   Echo 07/08/2020: 1. Left ventricular ejection fraction, by estimation, is 55 to 60%. The  left ventricle  has normal function. The left ventricle has no regional  wall motion abnormalities. Left ventricular diastolic parameters are  indeterminate.  2. Right ventricular systolic function is normal. The right ventricular  size is normal.  3. The mitral valve is normal in structure. No evidence of mitral valve  regurgitation. No evidence of mitral stenosis.  4. The aortic valve was not well visualized. Aortic valve regurgitation  is not visualized. No aortic stenosis is present.  5. The inferior vena cava is normal in size with greater than 50%  respiratory variability, suggesting right atrial pressure of 3 mmHg.  EKG:   11/01/2020: Sinus tachycardia, Rate 102 bpm   Recent Labs: 07/07/2020: BUN 12; Creatinine, Ser 0.57; Hemoglobin 15.8; Platelets 242; Potassium 4.5; Sodium 141  Recent Lipid Panel No results found for: CHOL, TRIG, HDL, CHOLHDL, VLDL, LDLCALC, LDLDIRECT  Physical Exam:    VS:  BP 120/80   Pulse (!) 102   Ht 5\' 7"  (1.702 m)   SpO2 100%   BMI 39.94 kg/m     Wt Readings from Last 5 Encounters:  07/14/20 255 lb (115.7 kg)  07/07/20 255 lb (115.7 kg)  11/27/18 234 lb 12.8 oz (106.5 kg)  11/18/18 286 lb 7 oz (129.9 kg)  04/30/18 267 lb (121.1 kg)    Constitutional: No acute distress Eyes: sclera non-icteric, normal conjunctiva and lids ENMT: normal dentition, moist mucous membranes Cardiovascular: regular rhythm, tachycardic rate, no murmurs. S1 and S2 normal. Radial pulses normal bilaterally. No jugular venous distention.  Respiratory: clear to auscultation bilaterally GI : normal bowel sounds, soft and nontender. No distention.   MSK: extremities warm, well perfused. No edema.  NEURO: grossly nonfocal exam, moves all extremities. PSYCH: alert and oriented x 3, normal mood and affect.   ASSESSMENT:    1. SOB (shortness of breath)   2. Precordial pain   3. Secondary hypertension   4. Palpitations    PLAN:    SOB (shortness of breath) Precordial pain -Her  symptoms of sinus tachycardia and paroxysms of shortness of breath which I am able to witness while in the room are concerning for PE.  We discussed most expeditious management, which will likely be a CT angiogram for PE in the emergency department.  Alternatively she can undergo D-dimer and lower extremity Dopplers in the ER and if positive then plan for CT angiography.  I will leave this to discretion of the emergency department physicians, but with shortness of breath outpatient management did not feel sufficient.  Fortunately she is not hypoxic.  Secondary hypertension-blood pressure is well controlled today.  Palpitations- she has had palpitations, unclear etiology, but could represent palpitations of sinus tachycardia.  Would like to exclude PE and complete work-up from a cardiovascular perspective if PE study negative.  Total time of encounter: 30 minutes total time of  encounter, including 20 minutes spent in face-to-face patient care on the date of this encounter. This time includes coordination of care and counseling regarding above mentioned problem list. Remainder of non-face-to-face time involved reviewing chart documents/testing relevant to the patient encounter and documentation in the medical record. I have independently reviewed documentation from referring provider.   Weston Brass, MD, [facility]  [facility]     Medication Adjustments/Labs and Tests Ordered: Current medicines are reviewed at length with the patient today.  Concerns regarding medicines are outlined above.   No orders of the defined types were placed in this encounter.   No orders of the defined types were placed in this encounter.   Patient Instructions  Medication Instructions:  The current medical regimen is effective;  continue present plan and medications.  *If you need a refill on your cardiac medications before your next appointment, please call your pharmacy*   Follow-Up: At [facility], you and your health needs are our priority.  As part of our continuing mission to provide you with exceptional heart care, we have created designated Provider Care Teams.  These Care Teams include your primary Cardiologist (physician) and Advanced Practice Providers (APPs -  Physician Assistants and Nurse Practitioners) who all work together to provide you with the care you need, when you need it.  We recommend signing up for the patient portal called "MyChart".  Sign up information is provided on this After Visit Summary.  MyChart is used to connect with patients for Virtual Visits (Telemedicine).  Patients are able to view lab/test results, encounter notes, upcoming appointments, etc.  Non-urgent messages can be sent to your provider as well.   To learn more about what you can do with MyChart, go to [email or web address].    Your next appointment:   Next few days following hospital visit   The format for your next appointment:   In Person  Provider:   Thurmon Fair, MD        [facility] Stumpf,acting as a scribe for Parke Poisson, MD.,have documented all relevant documentation on the behalf of Parke Poisson, MD,as directed by  Parke Poisson, MD while in the presence of Parke Poisson, MD.  I, Parke Poisson, MD, have reviewed all documentation for this visit. The documentation on 11/01/20 for the exam, diagnosis, procedures, and orders are all accurate and complete.

## 2020-11-01 NOTE — ED Provider Notes (Signed)
[facility]-EMERGENCY DEPT Provider Note   CSN: [id number] Arrival date & time: 11/01/20  0857     History Chief Complaint  Patient presents with  . Chest Pain  . Shortness of Breath    Eileen Buck is a 25 y.o. female.  Patient complains of chest discomfort off and on.  She has a history of hypertension.  The history is provided by the patient and medical records. No language interpreter was used.  Chest Pain Pain location:  L chest Pain quality: aching   Pain radiates to:  Does not radiate Pain severity:  Mild Onset quality:  Sudden Timing:  Intermittent Progression:  Waxing and waning Chronicity:  New Context: not breathing   Relieved by:  Nothing Worsened by:  Nothing Associated symptoms: shortness of breath   Associated symptoms: no abdominal pain, no back pain, no cough, no fatigue and no headache   Shortness of Breath Associated symptoms: chest pain   Associated symptoms: no abdominal pain, no cough, no headaches and no rash        Past Medical History:  Diagnosis Date  . Anxiety    xanax in past  . Headache   . Hypertension   . Nausea   . Weakness     Patient Active Problem List   Diagnosis Date Noted  . HTN (hypertension) 11/27/2018  . Chronic hypertension affecting pregnancy 11/18/2018  . NST (non-stress test) reactive 11/18/2018  . Acute appendicitis 09/16/2011    Past Surgical History:  Procedure Laterality Date  . ANKLE SURGERY  04/2005  . APPENDECTOMY  09/16/11  . LAPAROSCOPIC APPENDECTOMY  09/16/2011   Procedure: APPENDECTOMY LAPAROSCOPIC;  Surgeon: Velora Heckler, MD;  Location: [facility];  Service: General;  Laterality: N/A;  . NASAL SINUS SURGERY  08/2010     OB History    Gravida  1   Para  1   Term  1   Preterm      AB      Living  1     SAB      IAB      Ectopic      Multiple  0   Live Births  1           Family History  Problem Relation Age of Onset  . Hypertension Mother   . Heart  disease Father   . Cancer Paternal Grandmother        ovarian    Social History   Tobacco Use  . Smoking status: Never Smoker  . Smokeless tobacco: Never Used  Vaping Use  . Vaping Use: Never used  Substance Use Topics  . Alcohol use: No  . Drug use: No    Home Medications Prior to Admission medications   Medication Sig Start Date End Date Taking? Authorizing Provider  amLODipine (NORVASC) 5 MG tablet Take 1 tablet (5 mg total) by mouth daily. 10/11/20 01/09/21 Yes Croitoru, Mihai, MD  carvedilol (COREG) 6.25 MG tablet Take 1 tablet (6.25 mg total) by mouth 2 (two) times daily. Patient taking differently: Take 12.5 mg by mouth 2 (two) times daily. 07/14/20  Yes Croitoru, Mihai, MD  triamterene-hydrochlorothiazide (DYAZIDE) 37.5-25 MG capsule Take 1 each (1 capsule total) by mouth daily. 09/09/20  Yes Croitoru, Mihai, MD  ibuprofen (ADVIL) 600 MG tablet Take 1 tablet (600 mg total) by mouth every 6 (six) hours. Patient not taking: No sig reported 11/30/18   Levi Aland, MD  Multiple Vitamins-Minerals (MULTIVITAMIN WOMEN) TABS Take 1  tablet by mouth daily. Patient not taking: No sig reported    [provider]    Allergies    Patient has no known allergies.  Review of Systems   Review of Systems  Constitutional: Negative for appetite change and fatigue.  HENT: Negative for congestion, ear discharge and sinus pressure.   Eyes: Negative for discharge.  Respiratory: Positive for shortness of breath. Negative for cough.   Cardiovascular: Positive for chest pain.  Gastrointestinal: Negative for abdominal pain and diarrhea.  Genitourinary: Negative for frequency and hematuria.  Musculoskeletal: Negative for back pain.  Skin: Negative for rash.  Neurological: Negative for seizures and headaches.  Psychiatric/Behavioral: Negative for hallucinations.    Physical Exam Updated Vital Signs BP (!) 148/107 (BP Location: Left Arm)   Pulse 95   Temp 98.8 F (37.1 C) (Oral)    Resp 18   LMP 10/23/2020   SpO2 100%   Physical Exam Vitals and nursing note reviewed.  Constitutional:      Appearance: She is well-developed.  HENT:     Head: Normocephalic.     Mouth/Throat:     Mouth: Mucous membranes are moist.  Eyes:     General: No scleral icterus.    Conjunctiva/sclera: Conjunctivae normal.  Neck:     Thyroid: No thyromegaly.  Cardiovascular:     Rate and Rhythm: Normal rate and regular rhythm.     Heart sounds: No murmur heard. No friction rub. No gallop.   Pulmonary:     Breath sounds: No stridor. No wheezing or rales.  Chest:     Chest wall: No tenderness.  Abdominal:     General: There is no distension.     Tenderness: There is no abdominal tenderness. There is no rebound.  Musculoskeletal:        General: Normal range of motion.     Cervical back: Neck supple.  Lymphadenopathy:     Cervical: No cervical adenopathy.  Skin:    Findings: No erythema or rash.  Neurological:     Mental Status: She is alert and oriented to person, place, and time.     Motor: No abnormal muscle tone.     Coordination: Coordination normal.  Psychiatric:        Behavior: Behavior normal.     ED Results / Procedures / Treatments   Labs (all labs ordered are listed, but only abnormal results are displayed) Labs Reviewed  CBC WITH DIFFERENTIAL/PLATELET - Abnormal; Notable for the following components:      Result Value   WBC 11.2 (*)    RBC 5.33 (*)    Hemoglobin 15.6 (*)    HCT 47.5 (*)    All other components within normal limits  COMPREHENSIVE METABOLIC PANEL  D-DIMER, QUANTITATIVE  I-STAT BETA HCG BLOOD, ED ([facility], [facility], [facility] ONLY)  TROPONIN I (HIGH SENSITIVITY)  TROPONIN I (HIGH SENSITIVITY)    EKG None  Radiology DG Chest 2 View  Result Date: 11/01/2020 CLINICAL DATA:  25 year old female with shortness of breath and chest pain for 2 days. EXAM: CHEST - 2 VIEW COMPARISON:  Chest radiographs 04/28/2013 and earlier. FINDINGS: Lung volumes and  mediastinal contours are stable and within normal limits. Visualized tracheal air column is within normal limits. Both lungs appear stable and clear. No osseous abnormality identified. Paucity of bowel gas in the upper abdomen. IMPRESSION: Negative.  No cardiopulmonary abnormality. Electronically Signed   By: Odessa Fleming M.D.   On: 11/01/2020 10:41    Procedures Procedures  Medications Ordered in ED Medications  LORazepam (ATIVAN) injection 0.5 mg (0.5 mg Intravenous Given 11/01/20 0926)    ED Course  I have reviewed the triage vital signs and the nursing notes.  Pertinent labs & imaging results that were available during my care of the patient were reviewed by me and considered in my medical decision making (see chart for details).    MDM Rules/Calculators/A&P                          Labs and EKG chest x-ray unremarkable.  Doubt coronary artery disease causing this pain.  She has a cardiologist that she follows and will follow back up with a cardiologist Final Clinical Impression(s) / ED Diagnoses Final diagnoses:  Atypical chest pain    Rx / DC Orders ED Discharge Orders    None       Bethann Berkshire, MD 11/01/20 1240

## 2020-11-01 NOTE — Addendum Note (Signed)
Addended by: Brunetta Genera on: 11/01/2020 09:57 AM   Modules accepted: Orders

## 2020-11-01 NOTE — ED Triage Notes (Signed)
Pt reports that she has central chest pains and SOB that has been going on for a couple days. HR 102.  [facility] at [facility] and was told to go to ED for evaluation.

## 2020-11-11 ENCOUNTER — Emergency Department (HOSPITAL_COMMUNITY): Payer: Medicaid Other

## 2020-11-11 ENCOUNTER — Emergency Department (HOSPITAL_COMMUNITY)
Admission: EM | Admit: 2020-11-11 | Discharge: 2020-11-12 | Disposition: A | Discharge status: Home / Self Care | Payer: Medicaid Other | Attending: Emergency Medicine | Admitting: Emergency Medicine

## 2020-11-11 ENCOUNTER — Encounter (HOSPITAL_COMMUNITY): Payer: Self-pay

## 2020-11-11 ENCOUNTER — Other Ambulatory Visit: Payer: Self-pay

## 2020-11-11 DIAGNOSIS — M546 Pain in thoracic spine: Secondary | ICD-10-CM | POA: Insufficient documentation

## 2020-11-11 DIAGNOSIS — R0602 Shortness of breath: Secondary | ICD-10-CM | POA: Insufficient documentation

## 2020-11-11 DIAGNOSIS — D72829 Elevated white blood cell count, unspecified: Secondary | ICD-10-CM | POA: Insufficient documentation

## 2020-11-11 DIAGNOSIS — R079 Chest pain, unspecified: Secondary | ICD-10-CM | POA: Diagnosis not present

## 2020-11-11 DIAGNOSIS — I10 Essential (primary) hypertension: Secondary | ICD-10-CM | POA: Insufficient documentation

## 2020-11-11 DIAGNOSIS — Z79899 Other long term (current) drug therapy: Secondary | ICD-10-CM | POA: Diagnosis not present

## 2020-11-11 LAB — BASIC METABOLIC PANEL
Anion gap: 6 (ref 5–15)
BUN: 13 mg/dL (ref 6–20)
CO2: 26 mmol/L (ref 22–32)
Calcium: 9.5 mg/dL (ref 8.9–10.3)
Chloride: 103 mmol/L (ref 98–111)
Creatinine, Ser: 0.73 mg/dL (ref 0.44–1.00)
GFR, Estimated: >60 mL/min (ref >60)
Glucose, Bld: 96 mg/dL (ref 70–99)
Potassium: 4.1 mmol/L (ref 3.5–5.1)
Sodium: 135 mmol/L (ref 135–145)

## 2020-11-11 LAB — CBC
HCT: 44.6 % (ref 36.0–46.0)
Hemoglobin: 14.5 g/dL (ref 12.0–15.0)
MCH: 29.1 pg (ref 26.0–34.0)
MCHC: 32.5 g/dL (ref 30.0–36.0)
MCV: 89.4 fL (ref 80.0–100.0)
Platelets: 271 10*3/uL (ref 150–400)
RBC: 4.99 MIL/uL (ref 3.87–5.11)
RDW: 13.2 % (ref 11.5–15.5)
WBC: 14.7 10*3/uL — ABNORMAL HIGH (ref 4.0–10.5)
nRBC: 0 % (ref 0.0–0.2)

## 2020-11-11 LAB — TROPONIN I (HIGH SENSITIVITY)
Troponin I (High Sensitivity): 4 ng/L (ref <18)
Troponin I (High Sensitivity): 4 ng/L (ref <18)

## 2020-11-11 NOTE — ED Triage Notes (Signed)
Pt here c/o constant central chest pain and SOB  that has gotten worse over the past 3 days. Pt was told to come back to the ED if symptoms worsen.

## 2020-11-11 NOTE — ED Provider Notes (Signed)
Emergency Medicine Provider Triage Evaluation Note  Eileen Buck , a 25 y.o. female  was evaluated in triage.  Pt complains of sternal chest pain radiates through to back, associate with shortness of breath, constant, worse with taking a deep breath or bending over, pain not worse with with exertion.. Seen for chest pain 10 days ago and followed up with her cardiologist without specific diagnosis made.  Review of Systems  Positive: Chest pain, shortness of breath Negative: Fever, cough  Physical Exam  LMP 10/23/2020  Gen:   Awake, no distress   Resp:  Normal effort  MSK:   Moves extremities without difficulty  Other:    Medical Decision Making  Medically screening exam initiated at 5:37 PM.  Appropriate orders placed.  Eileen Buck was informed that the remainder of the evaluation will be completed by another provider, this initial triage assessment does not replace that evaluation, and the importance of remaining in the ED until their evaluation is complete.     Jeannie Fend, PA-C 11/11/20 1737    Benjiman Core, MD 11/11/20 [phone]

## 2020-11-12 ENCOUNTER — Encounter (HOSPITAL_COMMUNITY): Payer: Self-pay | Admitting: Student

## 2020-11-12 LAB — I-STAT BETA HCG BLOOD, ED (MC, WL, AP ONLY): I-stat hCG, quantitative: 11.5 m[IU]/mL — ABNORMAL HIGH (ref <5)

## 2020-11-12 LAB — POC URINE PREG, ED: Preg Test, Ur: NEGATIVE

## 2020-11-12 MED ORDER — PREDNISONE 20 MG PO TABS
40.0000 mg | ORAL_TABLET | Freq: Once | ORAL | Status: AC
  Administered 2020-11-12: 40 mg via ORAL
  Filled 2020-11-12: qty 2

## 2020-11-12 MED ORDER — METHOCARBAMOL 500 MG PO TABS: 500.0000 mg | ORAL_TABLET | Freq: Three times a day (TID) | ORAL | 0 refills | Status: DC | PRN

## 2020-11-12 MED ORDER — PREDNISONE 10 MG PO TABS: 40.0000 mg | ORAL_TABLET | Freq: Every day | ORAL | 0 refills | Status: AC

## 2020-11-12 MED ORDER — LIDOCAINE 5 % EX PTCH
2.0000 | MEDICATED_PATCH | CUTANEOUS | Status: DC
  Administered 2020-11-12: 2 via TRANSDERMAL
  Filled 2020-11-12: qty 2

## 2020-11-12 MED ORDER — LIDOCAINE 5 % EX PTCH: 1.0000 | MEDICATED_PATCH | Freq: Every day | CUTANEOUS | 0 refills | Status: DC | PRN

## 2020-11-12 NOTE — Discharge Instructions (Signed)
You were seen in the emergency department today for chest pain. Your work-up in the emergency department has been overall reassuring. Your labs have been fairly normal and or similar to previous blood work you have had done-White blood cell count was mildly elevated, please ask recheck by primary care provider.  Your urine pregnancy test was negative. Your EKG and the enzyme we use to check your heart did not show an acute heart attack at this time. Your chest x-ray was normal.   We are sending you home with the following medicines: - Prednisone: This is a steroid, take this daily in the morning for the next 4 days, we gave your first dose here in the emergency department, please start this tomorrow. -Robaxin: This is a muscle relaxant, take every 8 hours as needed for pain, do not drive or operate heavy machinery when taking this.  Do not take other sedating medications or drink alcohol with this. -Lidoderm patch: Please apply 1 patch to area with significant pain once per day.  Movement discard patch within 12 hours of application.  We have prescribed you new medication(s) today. Discuss the medications prescribed today with your pharmacist as they can have adverse effects and interactions with your other medicines including over the counter and prescribed medications. Seek medical evaluation if you start to experience new or abnormal symptoms after taking one of these medicines, seek care immediately if you start to experience difficulty breathing, feeling of your throat closing, facial swelling, or rash as these could be indications of a more serious allergic reaction   We would like you to follow up closely with your primary care provider and/or your cardiologist  within 1-3 days. Return to the ER immediately should you experience any new or worsening symptoms including but not limited to return of pain, worsened pain, vomiting, shortness of breath, dizziness, lightheadedness, passing out, coughing up  blood, or any other concerns that you may have.

## 2020-11-12 NOTE — ED Provider Notes (Signed)
[facility] EMERGENCY DEPARTMENT Provider Note   CSN: [id number] Arrival date & time: 11/11/20  1724     History Chief Complaint  Patient presents with  . Chest Pain    Tyshia Fenter is a 25 y.o. female with a hx of hypertension who presents to the  ED with complaints of chest pain x 2 weeks. Patient reports pain is located centrally in the area of the sternum as well as to her mid upper back, it is sharp in nature, constant, worse with twisting/turning motions and deep breathing.  No alleviating factors.  No intervention prior to arrival.  At times she feels short of breath with it.  She denies fever, chills, nausea, vomiting, diaphoresis, syncope,  leg pain/swelling, hemoptysis, recent surgery/trauma, recent long travel, hormone use, personal hx of cancer, or hx of DVT/PE.  Family history of early CAD (father) as well as a history of blood clots (mother).   HPI     Past Medical History:  Diagnosis Date  . Anxiety    xanax in past  . Headache   . Hypertension   . Nausea   . Weakness     Patient Active Problem List   Diagnosis Date Noted  . HTN (hypertension) 11/27/2018  . Chronic hypertension affecting pregnancy 11/18/2018  . NST (non-stress test) reactive 11/18/2018  . Acute appendicitis 09/16/2011    Past Surgical History:  Procedure Laterality Date  . ANKLE SURGERY  04/2005  . APPENDECTOMY  09/16/11  . LAPAROSCOPIC APPENDECTOMY  09/16/2011   Procedure: APPENDECTOMY LAPAROSCOPIC;  Surgeon: Velora Heckler, MD;  Location: [facility];  Service: General;  Laterality: N/A;  . NASAL SINUS SURGERY  08/2010     OB History    Gravida  1   Para  1   Term  1   Preterm      AB      Living  1     SAB      IAB      Ectopic      Multiple  0   Live Births  1           Family History  Problem Relation Age of Onset  . Hypertension Mother   . Heart disease Father   . Cancer Paternal Grandmother        ovarian    Social History   Tobacco  Use  . Smoking status: Never Smoker  . Smokeless tobacco: Never Used  Vaping Use  . Vaping Use: Never used  Substance Use Topics  . Alcohol use: No  . Drug use: No    Home Medications Prior to Admission medications   Medication Sig Start Date End Date Taking? Authorizing Provider  amLODipine (NORVASC) 5 MG tablet Take 1 tablet (5 mg total) by mouth daily. 10/11/20 01/09/21  Croitoru, Mihai, MD  carvedilol (COREG) 6.25 MG tablet Take 1 tablet (6.25 mg total) by mouth 2 (two) times daily. Patient taking differently: Take 12.5 mg by mouth 2 (two) times daily. 07/14/20   Croitoru, Mihai, MD  ibuprofen (ADVIL) 600 MG tablet Take 1 tablet (600 mg total) by mouth every 6 (six) hours. Patient not taking: No sig reported 11/30/18   Levi Aland, MD  Multiple Vitamins-Minerals (MULTIVITAMIN WOMEN) TABS Take 1 tablet by mouth daily. Patient not taking: No sig reported    [provider]  triamterene-hydrochlorothiazide (DYAZIDE) 37.5-25 MG capsule Take 1 each (1 capsule total) by mouth daily. 09/09/20   Croitoru, Rachelle Hora, MD  Allergies    Patient has no known allergies.  Review of Systems   Review of Systems  Constitutional: Negative for chills, diaphoresis and fever.  Respiratory: Positive for shortness of breath. Negative for cough.   Cardiovascular: Positive for chest pain. Negative for leg swelling.  Gastrointestinal: Negative for abdominal pain, nausea and vomiting.  Neurological: Negative for syncope.  All other systems reviewed and are negative.   Physical Exam Updated Vital Signs BP (!) 131/103 (BP Location: Right Arm)   Pulse 84   Temp 98.3 F (36.8 C) (Oral)   Resp 16   Ht 5\' 7"  (1.702 m)   Wt 115.7 kg   LMP 10/23/2020   SpO2 100%   BMI 39.94 kg/m   Physical Exam Vitals and nursing note reviewed.  Constitutional:      General: She is not in acute distress.    Appearance: She is well-developed. She is not toxic-appearing.  HENT:     Head: Normocephalic and  atraumatic.  Eyes:     General:        Right eye: No discharge.        Left eye: No discharge.     Conjunctiva/sclera: Conjunctivae normal.  Cardiovascular:     Rate and Rhythm: Normal rate and regular rhythm.     Pulses:          Dorsalis pedis pulses are 2+ on the right side and 2+ on the left side.  Pulmonary:     Effort: Pulmonary effort is normal. No respiratory distress.     Breath sounds: Normal breath sounds. No wheezing, rhonchi or rales.  Abdominal:     General: There is no distension.     Palpations: Abdomen is soft.     Tenderness: There is no abdominal tenderness.  Musculoskeletal:       Arms:     Cervical back: Neck supple.     Right lower leg: No tenderness. No edema.     Left lower leg: No tenderness. No edema.  Skin:    General: Skin is warm and dry.     Findings: No rash.  Neurological:     Mental Status: She is alert.     Comments: Clear speech.   Psychiatric:        Behavior: Behavior normal.     ED Results / Procedures / Treatments   Labs (all labs ordered are listed, but only abnormal results are displayed) Labs Reviewed  CBC - Abnormal; Notable for the following components:      Result Value   WBC 14.7 (*)    All other components within normal limits  BASIC METABOLIC PANEL  TROPONIN I (HIGH SENSITIVITY)  TROPONIN I (HIGH SENSITIVITY)    EKG EKG Interpretation  Date/Time:  Friday Nov 11 2020 17:42:25 EDT Ventricular Rate:  100 PR Interval:  146 QRS Duration: 82 QT Interval:  340 QTC Calculation: 438 R Axis:   74 Text Interpretation: Normal sinus rhythm Cannot rule out Anterior infarct , age undetermined Abnormal ECG Confirmed by 03-09-1991 (Geoffery Lyons) on 11/12/2020 5:08:07 AM   Radiology DG Chest 2 View  Result Date: 11/11/2020 CLINICAL DATA:  Chest pain and shortness of breath. EXAM: CHEST - 2 VIEW COMPARISON:  Nov 01, 2020 FINDINGS: The heart size and mediastinal contours are within normal limits. Both lungs are clear. The  visualized skeletal structures are unremarkable. IMPRESSION: No active cardiopulmonary disease. Electronically Signed   By: Nov 03, 2020 M.D.   On: 11/11/2020 18:09    Procedures  Procedures   Medications Ordered in ED Medications  lidocaine (LIDODERM) 5 % 2 patch (has no administration in time range)  predniSONE (DELTASONE) tablet 40 mg (has no administration in time range)    ED Course  I have reviewed the triage vital signs and the nursing notes.  Pertinent labs & imaging results that were available during my care of the patient were reviewed by me and considered in my medical decision making (see chart for details).    MDM Rules/Calculators/A&P                          Patient presents to the ED with complaints of chest pain.  Patient is nontoxic and resting comfortably, vitals within normal notes with the exception of elevated blood pressure.   Additional history obtained:  Additional history obtained from chart review & nursing note review.  Cardiology office visit 11/01/20- sent to ED for evaluation for possible PE ED visit 11/01/20 for chest pain- negative d-dimer, flat troponins, discharged.  Echo 06/2020: EF 55-60%  EKG: no STEMI  Lab Tests:  I reviewed and interpreted labs, which included:  CBC: Leukocytosis felt to be nonspecific at this time. BMP: Unremarkable Troponin: No significant , elevation, flat I-stat hcg test 11, therefore urine preg test obtained- negative.   Imaging Studies ordered:  CXR ordered by triage, I independently reviewed, formal radiology impression shows: No active cardiopulmonary disease.  EKG without findings of STEMI, troponins are flat, low risk hear score, I have a low suspicion for ACS at this time.  Patient is low risk [place], she is not hypoxic, she is no longer tachycardic as she was mildly on arrival, she had a D-dimer that was within normal limits with same pain, I have a low suspicion for PE.  No wide mediastinum on chest  x-ray, symmetric pulses, doubt dissection.  Chest x-ray without pneumonia, pulmonary edema, or pneumothorax.  Labs overall reassuring.  Unclear definitive etiology to patient's pain, however given pain is worse with certain twisting/turning movements she does have some tenderness we will trial treatment for costochondritis/MSK pain with prednisone, Lidoderm, and Robaxin.  We discussed no driving or operating heavy machinery while taking Robaxin.  PCP/cardiology follow-up I discussed results, treatment plan, need for follow-up, and return precautions with the patient. Provided opportunity for questions, patient confirmed understanding and is in agreement with plan.   Findings and plan of care discussed with supervising physician Dr. Judd Lien who is in agreement.   Portions of this note were generated with Scientist, clinical (histocompatibility and immunogenetics). Dictation errors may occur despite best attempts at proofreading.  Final Clinical Impression(s) / ED Diagnoses Final diagnoses:  Chest pain, unspecified type    Rx / DC Orders ED Discharge Orders         Ordered    methocarbamol (ROBAXIN) 500 MG tablet  Every 8 hours PRN        11/12/20 0638    predniSONE (DELTASONE) 10 MG tablet  Daily        11/12/20 0638    lidocaine (LIDODERM) 5 %  Daily PRN        11/12/20 0638           Cherly Anderson, PA-C 11/12/20 1791    Geoffery Lyons, MD 11/13/20 [phone]

## 2020-11-12 NOTE — ED Notes (Signed)
Pt discharged and ambulated out of the ED without difficulty. 

## 2020-12-16 ENCOUNTER — Other Ambulatory Visit: Payer: Self-pay | Admitting: *Deleted

## 2020-12-16 MED ORDER — CARVEDILOL 6.25 MG PO TABS: 6.2500 mg | ORAL_TABLET | Freq: Two times a day (BID) | ORAL | 3 refills | Status: DC

## 2021-01-27 ENCOUNTER — Ambulatory Visit: Payer: Medicaid Other | Admitting: Cardiovascular Disease

## 2021-05-03 ENCOUNTER — Other Ambulatory Visit: Payer: Self-pay | Admitting: Cardiovascular Disease

## 2021-05-12 ENCOUNTER — Other Ambulatory Visit: Payer: Self-pay | Admitting: Family Medicine

## 2021-05-12 DIAGNOSIS — N644 Mastodynia: Secondary | ICD-10-CM

## 2021-06-01 ENCOUNTER — Other Ambulatory Visit: Payer: Self-pay | Admitting: Cardiovascular Disease

## 2021-06-01 ENCOUNTER — Other Ambulatory Visit (HOSPITAL_COMMUNITY): Payer: Self-pay

## 2021-06-01 ENCOUNTER — Other Ambulatory Visit: Payer: Self-pay | Admitting: *Deleted

## 2021-06-01 MED ORDER — AMLODIPINE BESYLATE 5 MG PO TABS
5.0000 mg | ORAL_TABLET | Freq: Every day | ORAL | 3 refills | Status: DC
  Filled 2021-06-01 – 2021-06-09 (×2): qty 90, 90d supply, fill #0
  Filled 2021-11-15: qty 90, 90d supply, fill #1
  Filled 2022-01-26: qty 90, 90d supply, fill #2

## 2021-06-01 MED ORDER — TRIAMTERENE-HCTZ 37.5-25 MG PO CAPS: 1.0000 | ORAL_CAPSULE | Freq: Every day | ORAL | 3 refills | Status: DC

## 2021-06-01 MED ORDER — CARVEDILOL 6.25 MG PO TABS
6.2500 mg | ORAL_TABLET | Freq: Two times a day (BID) | ORAL | 3 refills | Status: DC
  Filled 2021-06-01 – 2021-06-09 (×2): qty 180, 90d supply, fill #0

## 2021-06-01 MED ORDER — CARVEDILOL 6.25 MG PO TABS: 6.2500 mg | ORAL_TABLET | Freq: Two times a day (BID) | ORAL | 3 refills | Status: DC

## 2021-06-01 MED ORDER — AMLODIPINE BESYLATE 5 MG PO TABS: 5.0000 mg | ORAL_TABLET | Freq: Every day | ORAL | 3 refills | Status: DC

## 2021-06-01 MED ORDER — TRIAMTERENE-HCTZ 37.5-25 MG PO CAPS
1.0000 | ORAL_CAPSULE | Freq: Every day | ORAL | 3 refills | Status: DC
  Filled 2021-06-01 – 2021-06-09 (×2): qty 90, 90d supply, fill #0
  Filled 2021-11-15: qty 90, 90d supply, fill #1
  Filled 2022-01-26: qty 90, 90d supply, fill #2
  Filled 2022-04-24 – 2022-05-09 (×2): qty 90, 90d supply, fill #3

## 2021-06-01 NOTE — Addendum Note (Signed)
Addended by: Sandi Mariscal on: 06/01/2021 01:49 PM   Modules accepted: Orders

## 2021-06-06 ENCOUNTER — Ambulatory Visit
Admission: RE | Admit: 2021-06-06 | Discharge: 2021-06-06 | Disposition: A | Discharge status: Home / Self Care | Payer: 59 | Source: Ambulatory Visit | Attending: Family Medicine | Admitting: Family Medicine

## 2021-06-06 DIAGNOSIS — N644 Mastodynia: Secondary | ICD-10-CM

## 2021-06-09 ENCOUNTER — Other Ambulatory Visit (HOSPITAL_COMMUNITY): Payer: Self-pay

## 2021-06-22 ENCOUNTER — Other Ambulatory Visit (HOSPITAL_COMMUNITY): Payer: Self-pay

## 2021-06-22 MED ORDER — ONDANSETRON HCL 8 MG PO TABS
ORAL_TABLET | ORAL | 1 refills | Status: DC
  Filled 2021-06-22: qty 20, 7d supply, fill #0
  Filled 2022-01-26: qty 20, 7d supply, fill #1

## 2021-07-03 ENCOUNTER — Other Ambulatory Visit (HOSPITAL_COMMUNITY): Payer: Self-pay

## 2021-07-03 MED ORDER — TOBRAMYCIN 0.3 % OP SOLN
OPHTHALMIC | 0 refills | Status: DC
Start: 2021-07-02 — End: 2021-09-28
  Filled 2021-07-03: qty 5, 8d supply, fill #0

## 2021-09-22 ENCOUNTER — Encounter (HOSPITAL_COMMUNITY): Payer: Self-pay | Admitting: Internal Medicine

## 2021-09-22 ENCOUNTER — Encounter (HOSPITAL_COMMUNITY): Admission: RE | Disposition: A | Discharge status: Home / Self Care | Payer: 59 | Attending: Internal Medicine

## 2021-09-22 ENCOUNTER — Ambulatory Visit (INDEPENDENT_AMBULATORY_CARE_PROVIDER_SITE_OTHER): Payer: 59 | Admitting: Cardiology

## 2021-09-22 ENCOUNTER — Encounter: Payer: Self-pay | Admitting: Cardiology

## 2021-09-22 ENCOUNTER — Other Ambulatory Visit: Payer: Self-pay

## 2021-09-22 ENCOUNTER — Other Ambulatory Visit (HOSPITAL_COMMUNITY): Payer: Self-pay

## 2021-09-22 ENCOUNTER — Ambulatory Visit (HOSPITAL_COMMUNITY)
Admission: RE | Admit: 2021-09-22 | Discharge: 2021-09-22 | Disposition: A | Discharge status: Home / Self Care | Payer: 59 | Source: Other Acute Inpatient Hospital | Attending: Internal Medicine | Admitting: Internal Medicine

## 2021-09-22 ENCOUNTER — Ambulatory Visit (HOSPITAL_COMMUNITY): Payer: 59 | Admitting: Anesthesiology

## 2021-09-22 ENCOUNTER — Ambulatory Visit (HOSPITAL_BASED_OUTPATIENT_CLINIC_OR_DEPARTMENT_OTHER): Payer: 59 | Admitting: Anesthesiology

## 2021-09-22 VITALS — BP 142/80 | HR 141 | Resp 12 | Ht 67.0 in | Wt 245.0 lb

## 2021-09-22 DIAGNOSIS — I4891 Unspecified atrial fibrillation: Secondary | ICD-10-CM | POA: Diagnosis present

## 2021-09-22 DIAGNOSIS — F419 Anxiety disorder, unspecified: Secondary | ICD-10-CM | POA: Diagnosis not present

## 2021-09-22 DIAGNOSIS — I10 Essential (primary) hypertension: Secondary | ICD-10-CM | POA: Diagnosis not present

## 2021-09-22 DIAGNOSIS — R000 Tachycardia, unspecified: Secondary | ICD-10-CM | POA: Diagnosis not present

## 2021-09-22 DIAGNOSIS — I480 Paroxysmal atrial fibrillation: Secondary | ICD-10-CM | POA: Insufficient documentation

## 2021-09-22 DIAGNOSIS — Z538 Procedure and treatment not carried out for other reasons: Secondary | ICD-10-CM | POA: Diagnosis not present

## 2021-09-22 LAB — COMPREHENSIVE METABOLIC PANEL
ALT: 23 IU/L (ref 0–32)
AST: 16 IU/L (ref 0–40)
Albumin/Globulin Ratio: 1.7 (ref 1.2–2.2)
Albumin: 4.6 g/dL (ref 3.9–5.0)
Alkaline Phosphatase: 87 IU/L (ref 44–121)
BUN/Creatinine Ratio: 17 (ref 9–23)
BUN: 11 mg/dL (ref 6–20)
Bilirubin Total: 0.3 mg/dL (ref 0.0–1.2)
CO2: 25 mmol/L (ref 20–29)
Calcium: 10.2 mg/dL (ref 8.7–10.2)
Chloride: 105 mmol/L (ref 96–106)
Creatinine, Ser: 0.63 mg/dL (ref 0.57–1.00)
Globulin, Total: 2.7 g/dL (ref 1.5–4.5)
Glucose: 95 mg/dL (ref 70–99)
Potassium: 5 mmol/L (ref 3.5–5.2)
Sodium: 142 mmol/L (ref 134–144)
Total Protein: 7.3 g/dL (ref 6.0–8.5)
eGFR: 125 mL/min/{1.73_m2} (ref >59)

## 2021-09-22 LAB — CBC
Hematocrit: 50.2 % — ABNORMAL HIGH (ref 34.0–46.6)
Hemoglobin: 16 g/dL — ABNORMAL HIGH (ref 11.1–15.9)
MCH: 27.9 pg (ref 26.6–33.0)
MCHC: 31.9 g/dL (ref 31.5–35.7)
MCV: 88 fL (ref 79–97)
Platelets: 292 10*3/uL (ref 150–450)
RBC: 5.73 x10E6/uL — ABNORMAL HIGH (ref 3.77–5.28)
RDW: 13.7 % (ref 11.7–15.4)
WBC: 12.4 10*3/uL — ABNORMAL HIGH (ref 3.4–10.8)

## 2021-09-22 LAB — HCG, SERUM, QUALITATIVE: hCG,Beta Subunit,Qual,Serum: NEGATIVE m[IU]/mL (ref <6)

## 2021-09-22 SURGERY — CANCELLED PROCEDURE
Anesthesia: General

## 2021-09-22 MED ORDER — APIXABAN 5 MG PO TABS
5.0000 mg | ORAL_TABLET | Freq: Two times a day (BID) | ORAL | 11 refills | Status: DC
  Filled 2021-09-22: qty 60, 30d supply, fill #0
  Filled 2021-11-15: qty 60, 30d supply, fill #1
  Filled 2022-01-26: qty 60, 30d supply, fill #2
  Filled 2022-03-02 – 2022-03-20 (×2): qty 60, 30d supply, fill #3
  Filled 2022-04-24 – 2022-05-09 (×2): qty 60, 30d supply, fill #4
  Filled 2022-06-05: qty 60, 30d supply, fill #5
  Filled 2022-08-08: qty 60, 30d supply, fill #6
  Filled 2022-09-07 – 2022-09-13 (×2): qty 60, 30d supply, fill #7

## 2021-09-22 MED ORDER — SODIUM CHLORIDE 0.9 % IV SOLN
INTRAVENOUS | Status: DC
Start: 2021-09-22 — End: 2021-09-22

## 2021-09-22 NOTE — Patient Instructions (Addendum)
Medication Instructions:  ?START Eliquis 5 mg twice daily ? ?*If you need a refill on your cardiac medications before your next appointment, please call your pharmacy* ? ? ?Lab Work: ?Your provider would like for you to have the following labs today: CBC, TSH, CMET and HGB ? ?If you have labs (blood work) drawn today and your tests are completely normal, you will receive your results only by: ?MyChart Message (if you have MyChart) OR ?A paper copy in the mail ?If you have any lab test that is abnormal or we need to change your treatment, we will call you to review the results. ? ? ?Testing/Procedures: ?Cardioversion ? ? ?Follow-Up: ?At [facility], you and your health needs are our priority.  As part of our continuing mission to provide you with exceptional heart care, we have created designated Provider Care Teams.  These Care Teams include your primary Cardiologist (physician) and Advanced Practice Providers (APPs -  Physician Assistants and Nurse Practitioners) who all work together to provide you with the care you need, when you need it. ? ?We recommend signing up for the patient portal called "MyChart".  Sign up information is provided on this After Visit Summary.  MyChart is used to connect with patients for Virtual Visits (Telemedicine).  Patients are able to view lab/test results, encounter notes, upcoming appointments, etc.  Non-urgent messages can be sent to your provider as well.   ?To learn more about what you can do with MyChart, go to [email or web address].   ? ?Your next appointment:   ?Keep your afib follow up appointment ? ? ?Other Instructions ? ?You are scheduled for a Cardioversion on 3/31 with Dr. Harl Bowie.  Please arrive at the [facility] (Main Entrance A) at [facility]: [address] [place], [place] [address] at 2:30 pm. (1 hour prior to procedure) ? ?DIET: Nothing to eat or drink after midnight except a sip of water with medications (see medication instructions below) ? ?FYI:  For your safety, and to allow [place] to monitor your vital signs accurately during the surgery/procedure we request that   ?if you have artificial nails, gel coating, SNS etc. Please have those removed prior to your surgery/procedure. Not having the nail coverings /polish removed may result in cancellation or delay of your surgery/procedure. ? ? ?Medication Instructions: ?Hold Nothing to hold ? ?Continue your anticoagulant: Eliquis ?You will need to continue your anticoagulant after your procedure until you  are told by your provider that it is safe to stop ? ? ?Labs: STAT labs today ?You must have a responsible person to drive you home and stay in the waiting area during your procedure. Failure to do so could result in cancellation. ? ?Interior and spatial designer cards. ? ?*Special Note: Every effort is made to have your procedure done on time. Occasionally there are emergencies that occur at the hospital that may cause delays. Please be patient if a delay does occur.  ? ?

## 2021-09-22 NOTE — Interval H&P Note (Signed)
History and Physical Interval Note: ? ?09/22/2021 ?2:20 PM ? ?Eileen Buck  has presented today for surgery, with the diagnosis of afib.  The various methods of treatment have been discussed with the patient and family. After consideration of risks, benefits and other options for treatment, the patient has consented to  Procedure(s): ?CARDIOVERSION (N/A) as a surgical intervention.  The patient's history has been reviewed, patient examined, no change in status, stable for surgery.  I have reviewed the patient's chart and labs.  Questions were answered to the patient's satisfaction.   ? ? ?[name] ? ? ?

## 2021-09-22 NOTE — H&P (View-Only) (Signed)
?  ?Cardiology Office Note ? ? ?Date:  09/22/2021  ? ?ID:  Eileen Buck, DOB Aug 17, 1995, MRN [id number] ? ?PCP:  Eileen Booze, NP  ?Cardiologist:   Sanda Klein, MD ? ? ?Chief Complaint  ?Patient presents with  ? Atrial Fibrillation  ? ? ?  ?History of Present Illness: ?Eileen Buck is a 26 y.o. female who presents for evaluation of palpitations.  She has previously seen Dr. Sallyanne Kuster.   She has had a history of chest discomfort.  She has had hypertension.  She has had some tachycardia but has never been found to have atrial fibrillation.  She did have an echocardiogram last year with an ejection fraction of 55 to 60%.  There were no significant valvular abnormalities. ? ?She has not been feeling well for the Past couple of weeks.  She has been fatigued.  She said that she gets tired with mild activity.  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  This morning she was uncomfortable in bed.  She was short of breath.  She was tossing and turning.  She had a little back soreness.  At 2 AM she noticed her heart was racing.  This morning her heart rate was 150s when she had her Apple Watch on.  We went back and looked and it was in the 60s and 70s the past 7 days.  She clearly thinks this started at 2 in the morning.  We did an EKG in the office and she is found to be in atrial fibrillation 143 beats a minute.  She is not having any chest pressure, neck or arm discomfort.  Is not really having any shortness of breath, PND or orthopnea.  She feels "clammy and a little lightheaded.  She feels slightly uncomfortable. ? ? ?Past Medical History:  ?Diagnosis Date  ? Anxiety   ? xanax in past  ? Headache   ? Hypertension   ? Nausea   ? Weakness   ? ? ?Past Surgical History:  ?Procedure Laterality Date  ? ANKLE SURGERY  04/2005  ? APPENDECTOMY  09/16/11  ? LAPAROSCOPIC APPENDECTOMY  09/16/2011  ? Procedure: APPENDECTOMY LAPAROSCOPIC;  Surgeon: Earnstine Regal, MD;  Location: [facility];  Service: General;  Laterality:  N/A;  ? NASAL SINUS SURGERY  08/2010  ? ? ? ?Current Outpatient Medications  ?Medication Sig Dispense Refill  ? amLODipine (NORVASC) 5 MG tablet Take 1 tablet (5 mg total) by mouth daily. 90 tablet 3  ? carvedilol (COREG) 6.25 MG tablet Take 1 tablet (6.25 mg total) by mouth 2 (two) times daily. 180 tablet 3  ? methocarbamol (ROBAXIN) 500 MG tablet Take 1 tablet (500 mg total) by mouth every 8 (eight) hours as needed for muscle spasms. 15 tablet 0  ? ondansetron (ZOFRAN) 8 MG tablet Take 1 tablet by mouth every 8  hours as needed for Nausea. 20 tablet 1  ? triamterene-hydrochlorothiazide (DYAZIDE) 37.5-25 MG capsule Take 1 capsule by mouth daily. 90 capsule 3  ? ibuprofen (ADVIL) 600 MG tablet Take 1 tablet (600 mg total) by mouth every 6 (six) hours. (Patient not taking: No sig reported) 30 tablet 0  ? lidocaine (LIDODERM) 5 % Place 1 patch onto the skin daily as needed. Apply patch to area most significant pain once per day.  Remove and discard patch within 12 hours of application. 30 patch 0  ? Multiple Vitamins-Minerals (MULTIVITAMIN WOMEN) TABS Take 1 tablet by mouth daily. (Patient not taking: No sig reported)    ?  tobramycin (TOBREX) 0.3 % ophthalmic solution Instill 2 drops into the right eye every 4 hours until directed to stop. 5 mL 0  ? ?No current facility-administered medications for this visit.  ? ? ?Allergies:   Patient has no known allergies.  ? ? ?ROS:  Please see the history of present illness.   Otherwise, review of systems are positive for none.   All other systems are reviewed and negative.  ? ? ?PHYSICAL EXAM: ?VS:  BP (!) 142/80   Pulse (!) 141   Resp 12   Ht 5\' 7"  (1.702 m)   Wt 245 lb (111.1 kg)   SpO2 93%   BMI 38.37 kg/m?  , BMI Body mass index is 38.37 kg/m?. ?GENERAL:  Well appearing ?HEENT:  Pupils equal round and reactive, fundi not visualized, oral mucosa unremarkable ?NECK:  No jugular venous distention, waveform within normal limits, carotid upstroke brisk and symmetric, no  bruits, no thyromegaly ?LYMPHATICS:  No cervical, inguinal adenopathy ?LUNGS:  Clear to auscultation bilaterally ?BACK:  No CVA tenderness ?CHEST:  Unremarkable ?HEART:  PMI not displaced or sustained,S1 and S2 within normal limits, no S3, no clicks, no rubs, no murmurs ?ABD:  Flat, positive bowel sounds normal in frequency in pitch, no bruits, no rebound, no guarding, no midline pulsatile mass, no hepatomegaly, no splenomegaly ?EXT:  2 plus pulses throughout, no edema, no cyanosis no clubbing ?SKIN:  No rashes no nodules ?NEURO:  Cranial nerves II through XII grossly intact, motor grossly intact throughout ?PSYCH:  Cognitively intact, oriented to person place and time ? ? ? ?EKG:  EKG is ordered today. ?The ekg ordered today demonstrates atrial fibrillation with a rate of 143, axis within normal limits, intervals within normal limits, no acute ST-T wave changes. ? ? ?Recent Labs: ?11/01/2020: ALT 24 ?11/11/2020: BUN 13; Creatinine, Ser 0.73; Hemoglobin 14.5; Platelets 271; Potassium 4.1; Sodium 135  ? ? ?Lipid Panel ?No results found for: CHOL, TRIG, HDL, CHOLHDL, VLDL, LDLCALC, LDLDIRECT ?  ? ?Wt Readings from Last 3 Encounters:  ?09/22/21 245 lb (111.1 kg)  ?11/11/20 255 lb (115.7 kg)  ?07/14/20 255 lb (115.7 kg)  ?  ? ? ?Other studies Reviewed: ?Additional studies/ records that were reviewed today include: Labs, previous records. ?Review of the above records demonstrates:  Please see elsewhere in the note.   ? ? ?ASSESSMENT AND PLAN: ? ?Atrial fibrillation: The patient clearly went into atrial fibrillation early this morning.  I can check her Apple Watch and see that her heart rate was not elevated prior to this.  She knows the exact onset of this.  She is NPO.  She has no reason to not have cardioversion today.  I think she is symptomatic with this.  We have arranged cardioversion in the hospital.  We will check stat labs to include a TSH, chemistry, CBC, beta hCG.  She had previous work-up to include an echo.  I  will give her Eliquis starting today 5 mg and she can continue this postprocedure.  Again this has been less than 48-hour onset.  She understands the risk benefits. ? ?Hypertension: She can continue the meds as listed. ? ? ?Current medicines are reviewed at length with the patient today.  The patient does not have concerns regarding medicines. ? ?The following changes have been made:  no change ? ?Labs/ tests ordered today include:  ? ?Orders Placed This Encounter  ?Procedures  ? CBC  ? TSH  ? Comprehensive metabolic panel  ? hCG, serum, qualitative  ?  EKG 12-Lead  ? ? ? ?Disposition:   FU with Dr. Sallyanne Kuster.   ? ? ?Signed, ?Minus Breeding, MD  ?09/22/2021 8:57 AM    ?[place] ? ? ? ?

## 2021-09-22 NOTE — Transfer of Care (Signed)
Immediate Anesthesia Transfer of Care Note ? ?Patient: Eileen Buck ? ?Procedure(s) Performed: CARDIOVERSION ? ?Patient Location: Endoscopy Unit ? ?Anesthesia Type:General ? ?Level of Consciousness: awake ? ?Airway & Oxygen Therapy: Patient Spontanous Breathing ? ?Post-op Assessment: Report given to RN ? ?Post vital signs: Reviewed and stable ? ?Last Vitals:  ?Vitals Value Taken Time  ?BP 170/103 09/22/21 1426  ?Temp 36.6 ?C 09/22/21 1426  ?Pulse 109 09/22/21 1426  ?Resp 16 09/22/21 1426  ?SpO2 100 % 09/22/21 1426  ? ? ?Last Pain:  ?Vitals:  ? 09/22/21 1426  ?TempSrc: Temporal  ?PainSc: 0-No pain  ?   ? ?  ? ?Complications: No notable events documented. ?

## 2021-09-22 NOTE — Anesthesia Preprocedure Evaluation (Addendum)
Anesthesia Evaluation  ? ? ?Airway ? ? ? ? ? ? ? Dental ?  ?Pulmonary ? ?  ? ? ? ? ? ? ? Cardiovascular ?hypertension, Pt. on medications and Pt. on home beta blockers ?+ dysrhythmias Atrial Fibrillation  ? ?'22 ECHO: EF 55- 60%. The LV has normal function, no regional wall motion abnormalities. Right ventricular systolic function is normal, no significant valvular abnormalities ?  ?Neuro/Psych ?  ? GI/Hepatic ?  ?Endo/Other  ? ? Renal/GU ?  ? ?  ?Musculoskeletal ? ? Abdominal ?  ?Peds ? Hematology ?eliquis   ?Anesthesia Other Findings ? ? Reproductive/Obstetrics ? ?  ? ? ? ? ? ? ? ? ? ? ? ? ? ?  ?  ? ? ? ? ? ? ? ? ?Anesthesia Physical ?Anesthesia Plan ? ?ASA: 3 ? ?Anesthesia Plan: General  ? ?Post-op Pain Management: Minimal or no pain anticipated  ? ?Induction: Intravenous ? ?PONV Risk Score and Plan: 3 and Treatment may vary due to age or medical condition ? ?Airway Management Planned: Natural Airway and Mask ? ?Additional Equipment: None ? ?Intra-op Plan:  ? ?Post-operative Plan:  ? ?Informed Consent:  ? ?Plan Discussed with:  ? ?Anesthesia Plan Comments: (Case cancelled by cards prior to pre-op completion, Pt in Sinus Rhythm)  ? ? ? ? ? ?Anesthesia Quick Evaluation ? ?

## 2021-09-22 NOTE — Progress Notes (Signed)
?  ?Cardiology Office Note ? ? ?Date:  09/22/2021  ? ?ID:  Eileen Buck, DOB 10/07/1995, MRN [id number] ? ?PCP:  White, Marsha L, NP  ?Cardiologist:   Mihai Croitoru, MD ? ? ?Chief Complaint  ?Patient presents with  ? Atrial Fibrillation  ? ? ?  ?History of Present Illness: ?Eileen Buck is a 26 y.o. female who presents for evaluation of palpitations.  She has previously seen Dr. Croitoru.   She has had a history of chest discomfort.  She has had hypertension.  She has had some tachycardia but has never been found to have atrial fibrillation.  She did have an echocardiogram last year with an ejection fraction of 55 to 60%.  There were no significant valvular abnormalities. ? ?She has not been feeling well for the Past couple of weeks.  She has been fatigued.  She said that she gets tired with mild activity.  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  This morning she was uncomfortable in bed.  She was short of breath.  She was tossing and turning.  She had a little back soreness.  At 2 AM she noticed her heart was racing.  This morning her heart rate was 150s when she had her Apple Watch on.  We went back and looked and it was in the 60s and 70s the past 7 days.  She clearly thinks this started at 2 in the morning.  We did an EKG in the office and she is found to be in atrial fibrillation 143 beats a minute.  She is not having any chest pressure, neck or arm discomfort.  Is not really having any shortness of breath, PND or orthopnea.  She feels "clammy and a little lightheaded.  She feels slightly uncomfortable. ? ? ?Past Medical History:  ?Diagnosis Date  ? Anxiety   ? xanax in past  ? Headache   ? Hypertension   ? Nausea   ? Weakness   ? ? ?Past Surgical History:  ?Procedure Laterality Date  ? ANKLE SURGERY  04/2005  ? APPENDECTOMY  09/16/11  ? LAPAROSCOPIC APPENDECTOMY  09/16/2011  ? Procedure: APPENDECTOMY LAPAROSCOPIC;  Surgeon: Todd M Gerkin, MD;  Location: [facility];  Service: General;  Laterality:  N/A;  ? NASAL SINUS SURGERY  08/2010  ? ? ? ?Current Outpatient Medications  ?Medication Sig Dispense Refill  ? amLODipine (NORVASC) 5 MG tablet Take 1 tablet (5 mg total) by mouth daily. 90 tablet 3  ? carvedilol (COREG) 6.25 MG tablet Take 1 tablet (6.25 mg total) by mouth 2 (two) times daily. 180 tablet 3  ? methocarbamol (ROBAXIN) 500 MG tablet Take 1 tablet (500 mg total) by mouth every 8 (eight) hours as needed for muscle spasms. 15 tablet 0  ? ondansetron (ZOFRAN) 8 MG tablet Take 1 tablet by mouth every 8  hours as needed for Nausea. 20 tablet 1  ? triamterene-hydrochlorothiazide (DYAZIDE) 37.5-25 MG capsule Take 1 capsule by mouth daily. 90 capsule 3  ? ibuprofen (ADVIL) 600 MG tablet Take 1 tablet (600 mg total) by mouth every 6 (six) hours. (Patient not taking: No sig reported) 30 tablet 0  ? lidocaine (LIDODERM) 5 % Place 1 patch onto the skin daily as needed. Apply patch to area most significant pain once per day.  Remove and discard patch within 12 hours of application. 30 patch 0  ? Multiple Vitamins-Minerals (MULTIVITAMIN WOMEN) TABS Take 1 tablet by mouth daily. (Patient not taking: No sig reported)    ?   tobramycin (TOBREX) 0.3 % ophthalmic solution Instill 2 drops into the right eye every 4 hours until directed to stop. 5 mL 0  ? ?No current facility-administered medications for this visit.  ? ? ?Allergies:   Patient has no known allergies.  ? ? ?ROS:  Please see the history of present illness.   Otherwise, review of systems are positive for none.   All other systems are reviewed and negative.  ? ? ?PHYSICAL EXAM: ?VS:  BP (!) 142/80   Pulse (!) 141   Resp 12   Ht 5' 7" (1.702 m)   Wt 245 lb (111.1 kg)   SpO2 93%   BMI 38.37 kg/m?  , BMI Body mass index is 38.37 kg/m?. ?GENERAL:  Well appearing ?HEENT:  Pupils equal round and reactive, fundi not visualized, oral mucosa unremarkable ?NECK:  No jugular venous distention, waveform within normal limits, carotid upstroke brisk and symmetric, no  bruits, no thyromegaly ?LYMPHATICS:  No cervical, inguinal adenopathy ?LUNGS:  Clear to auscultation bilaterally ?BACK:  No CVA tenderness ?CHEST:  Unremarkable ?HEART:  PMI not displaced or sustained,S1 and S2 within normal limits, no S3, no clicks, no rubs, no murmurs ?ABD:  Flat, positive bowel sounds normal in frequency in pitch, no bruits, no rebound, no guarding, no midline pulsatile mass, no hepatomegaly, no splenomegaly ?EXT:  2 plus pulses throughout, no edema, no cyanosis no clubbing ?SKIN:  No rashes no nodules ?NEURO:  Cranial nerves II through XII grossly intact, motor grossly intact throughout ?PSYCH:  Cognitively intact, oriented to person place and time ? ? ? ?EKG:  EKG is ordered today. ?The ekg ordered today demonstrates atrial fibrillation with a rate of 143, axis within normal limits, intervals within normal limits, no acute ST-T wave changes. ? ? ?Recent Labs: ?11/01/2020: ALT 24 ?11/11/2020: BUN 13; Creatinine, Ser 0.73; Hemoglobin 14.5; Platelets 271; Potassium 4.1; Sodium 135  ? ? ?Lipid Panel ?No results found for: CHOL, TRIG, HDL, CHOLHDL, VLDL, LDLCALC, LDLDIRECT ?  ? ?Wt Readings from Last 3 Encounters:  ?09/22/21 245 lb (111.1 kg)  ?11/11/20 255 lb (115.7 kg)  ?07/14/20 255 lb (115.7 kg)  ?  ? ? ?Other studies Reviewed: ?Additional studies/ records that were reviewed today include: Labs, previous records. ?Review of the above records demonstrates:  Please see elsewhere in the note.   ? ? ?ASSESSMENT AND PLAN: ? ?Atrial fibrillation: The patient clearly went into atrial fibrillation early this morning.  I can check her Apple Watch and see that her heart rate was not elevated prior to this.  She knows the exact onset of this.  She is NPO.  She has no reason to not have cardioversion today.  I think she is symptomatic with this.  We have arranged cardioversion in the hospital.  We will check stat labs to include a TSH, chemistry, CBC, beta hCG.  She had previous work-up to include an echo.  I  will give her Eliquis starting today 5 mg and she can continue this postprocedure.  Again this has been less than 288-hour onset.  She understands the risk benefits. ? ?Hypertension: She can continue the meds as listed. ? ? ?Current medicines are reviewed at length with the patient today.  The patient does not have concerns regarding medicines. ? ?The following changes have been made:  no change ? ?Labs/ tests ordered today include:  ? ?Orders Placed This Encounter  ?Procedures  ? CBC  ? TSH  ? Comprehensive metabolic panel  ? hCG, serum, qualitative  ?   EKG 12-Lead  ? ? ? ?Disposition:   FU with Dr. Croitoru.   ? ? ?Signed, ?Renisha Cockrum, MD  ?09/22/2021 8:57 AM    ?[facility] ? ? ? ?

## 2021-09-22 NOTE — CV Procedure (Incomplete)
Procedure: Electrical Cardioversion ?Indications:  Atrial Fibrillation ? ?Procedure Details: ? ?Consent: {consent:3041460} ? ?Time Out: Verified patient identification, verified procedure, site/side was marked, verified correct patient position, special equipment/implants available, medications/allergies/relevent history reviewed, required imaging and test results available. PERFORMED. ? ?Patient placed on cardiac monitor, pulse oximetry, supplemental oxygen as necessary.  ?Sedation given: {sedation:3041479} ?Pacer pads placed {pad placement:3041462}. ? ?Cardioverted {number:3041463} time(s).  ?Cardioversion with synchronized biphasic {cardioversion:3041464} shock. ? ?Evaluation: ?Findings: Post procedure EKG shows: {post procedure:3041466} ?Complications: {complications:3041465} ?Patient {did/not:14019} tolerate procedure well. ? ?Time Spent Directly with the Patient: ? ?***minutes  ? ?[name] ?09/22/2021, 2:21 PM ? ?

## 2021-09-23 LAB — TSH: TSH: 1.39 u[IU]/mL (ref 0.450–4.500)

## 2021-09-25 ENCOUNTER — Other Ambulatory Visit (HOSPITAL_COMMUNITY): Payer: Self-pay

## 2021-09-25 ENCOUNTER — Other Ambulatory Visit: Payer: Self-pay | Admitting: *Deleted

## 2021-09-25 DIAGNOSIS — I4891 Unspecified atrial fibrillation: Secondary | ICD-10-CM

## 2021-09-25 NOTE — Plan of Care (Signed)
Cardioversion was aborted secondary to spontaneous conversion to sinus tachycardia. ?

## 2021-09-25 NOTE — Progress Notes (Signed)
Sleep study ordered for afib and possible sleep apnea ?

## 2021-09-28 ENCOUNTER — Ambulatory Visit (HOSPITAL_COMMUNITY): Payer: 59 | Admitting: Physician Assistant

## 2021-09-28 ENCOUNTER — Other Ambulatory Visit (HOSPITAL_COMMUNITY): Payer: Self-pay

## 2021-09-28 ENCOUNTER — Encounter (HOSPITAL_COMMUNITY): Payer: Self-pay | Admitting: Physician Assistant

## 2021-09-28 ENCOUNTER — Ambulatory Visit (HOSPITAL_COMMUNITY)
Admission: RE | Admit: 2021-09-28 | Discharge: 2021-09-28 | Disposition: A | Discharge status: Home / Self Care | Payer: 59 | Source: Ambulatory Visit | Attending: Physician Assistant | Admitting: Physician Assistant

## 2021-09-28 VITALS — BP 128/90 | HR 89 | Ht 67.0 in | Wt 247.4 lb

## 2021-09-28 DIAGNOSIS — Z7901 Long term (current) use of anticoagulants: Secondary | ICD-10-CM | POA: Diagnosis not present

## 2021-09-28 DIAGNOSIS — Z6838 Body mass index (BMI) 38.0-38.9, adult: Secondary | ICD-10-CM | POA: Insufficient documentation

## 2021-09-28 DIAGNOSIS — I10 Essential (primary) hypertension: Secondary | ICD-10-CM | POA: Diagnosis not present

## 2021-09-28 DIAGNOSIS — Z79899 Other long term (current) drug therapy: Secondary | ICD-10-CM | POA: Insufficient documentation

## 2021-09-28 DIAGNOSIS — I480 Paroxysmal atrial fibrillation: Secondary | ICD-10-CM

## 2021-09-28 DIAGNOSIS — E669 Obesity, unspecified: Secondary | ICD-10-CM | POA: Diagnosis not present

## 2021-09-28 MED ORDER — CARVEDILOL 12.5 MG PO TABS
12.5000 mg | ORAL_TABLET | Freq: Two times a day (BID) | ORAL | 3 refills | Status: DC
Start: 2021-09-28 — End: 2022-02-09
  Filled 2021-09-28: qty 60, 30d supply, fill #0
  Filled 2021-11-15: qty 60, 30d supply, fill #1
  Filled 2022-01-26: qty 60, 30d supply, fill #2

## 2021-09-28 NOTE — Progress Notes (Signed)
? ? ?Primary Care Physician: Jettie Booze, NP ?Primary Cardiologist: Dr Sallyanne Kuster  ?Primary Electrophysiologist: none ?Referring Physician: Dr Percival Spanish  ? ? ?Eileen Buck is a 26 y.o. female with a history of HTN and atrial fibrillation who presents for consultation in the [facility]. The patient was initially diagnosed with atrial fibrillation 09/22/21 after presenting to [facility] with symptoms of rapid heart rate, clamminess, and lightheadedness. ECG showed rapid afib. She was sent to the hospital for DCCV but spontaneously converted prior to the procedure. Patient is on Eliquis for a CHADS2VASC score of 2. She remains in SR today. No bleeding issues on anticoagulation. A sleep study has been ordered and is pending scheduling. In hindsight, she has had intermittent symptoms like this previously.  ? ?Today, she denies symptoms of palpitations, chest pain, shortness of breath, orthopnea, PND, lower extremity edema, dizziness, presyncope, syncope, bleeding, or neurologic sequela. The patient is tolerating medications without difficulties and is otherwise without complaint today.  ? ? ?Atrial Fibrillation Risk Factors: ? ?she does have symptoms or diagnosis of sleep apnea. ?she is agreeable for sleep study. ?she does not have a history of rheumatic fever. ? ? ?she has a BMI of Body mass index is 38.75 kg/m?Marland KitchenMarland Kitchen ?Filed Weights  ? 09/28/21 1420  ?Weight: 112.2 kg  ? ? ?Family History  ?Problem Relation Age of Onset  ? Hypertension Mother   ? Heart disease Father   ? Cancer Paternal Grandmother   ?     ovarian  ? ? ? ?Atrial Fibrillation Management history: ? ?Previous antiarrhythmic drugs: none ?Previous cardioversions: none ?Previous ablations: none ?CHADS2VASC score: 2 ?Anticoagulation history: Eliquis ? ? ?Past Medical History:  ?Diagnosis Date  ? Anxiety   ? xanax in past  ? Headache   ? Hypertension   ? Nausea   ? Weakness   ? ?Past Surgical History:  ?Procedure Laterality Date  ?  ANKLE SURGERY  04/2005  ? APPENDECTOMY  09/16/11  ? LAPAROSCOPIC APPENDECTOMY  09/16/2011  ? Procedure: APPENDECTOMY LAPAROSCOPIC;  Surgeon: Earnstine Regal, MD;  Location: [facility];  Service: General;  Laterality: N/A;  ? NASAL SINUS SURGERY  08/2010  ? ? ?Current Outpatient Medications  ?Medication Sig Dispense Refill  ? amLODipine (NORVASC) 5 MG tablet Take 1 tablet (5 mg total) by mouth daily. 90 tablet 3  ? apixaban (ELIQUIS) 5 MG TABS tablet Take 1 tablet (5 mg total) by mouth 2 (two) times daily. 60 tablet 11  ? carvedilol (COREG) 6.25 MG tablet Take 1 tablet (6.25 mg total) by mouth 2 (two) times daily. 180 tablet 3  ? ondansetron (ZOFRAN) 8 MG tablet Take 1 tablet by mouth every 8  hours as needed for Nausea. 20 tablet 1  ? triamterene-hydrochlorothiazide (DYAZIDE) 37.5-25 MG capsule Take 1 capsule by mouth daily. 90 capsule 3  ? ?No current facility-administered medications for this encounter.  ? ? ?No Known Allergies ? ?Social History  ? ?Socioeconomic History  ? Marital status: Single  ?  Spouse name: Not on file  ? Number of children: Not on file  ? Years of education: Not on file  ? Highest education level: Not on file  ?Occupational History  ? Not on file  ?Tobacco Use  ? Smoking status: Never  ? Smokeless tobacco: Never  ? Tobacco comments:  ?  Never smoke 09/28/21  ?Vaping Use  ? Vaping Use: Never used  ?Substance and Sexual Activity  ? Alcohol use: No  ? Drug use:  No  ? Sexual activity: Yes  ?  Birth control/protection: None  ?Other Topics Concern  ? Not on file  ?Social History Narrative  ? Not on file  ? ?Social Determinants of Health  ? ?Financial Resource Strain: Not on file  ?Food Insecurity: Not on file  ?Transportation Needs: Not on file  ?Physical Activity: Not on file  ?Stress: Not on file  ?Social Connections: Not on file  ?Intimate Partner Violence: Not on file  ? ? ? ?ROS- All systems are reviewed and negative except as per the HPI above. ? ?Physical Exam: ?Vitals:  ? 09/28/21 1420  ?BP:  128/90  ?Pulse: 89  ?Weight: 112.2 kg  ?Height: 5\' 7"  (1.702 m)  ? ? ?GEN- The patient is a well appearing obese female, alert and oriented x 3 today.   ?Head- normocephalic, atraumatic ?Eyes-  Sclera clear, conjunctiva pink ?Ears- hearing intact ?Oropharynx- clear ?Neck- supple  ?Lungs- Clear to ausculation bilaterally, normal work of breathing ?Heart- Regular rate and rhythm, no murmurs, rubs or gallops  ?GI- soft, NT, ND, + BS ?Extremities- no clubbing, cyanosis, or edema ?MS- no significant deformity or atrophy ?Skin- no rash or lesion ?Psych- euthymic mood, full affect ?Neuro- strength and sensation are intact ? ?Wt Readings from Last 3 Encounters:  ?09/28/21 112.2 kg  ?09/22/21 111.1 kg  ?11/11/20 115.7 kg  ? ? ?EKG today demonstrates  ?SR ?Vent. rate 89 BPM ?PR interval 154 ms ?QRS duration 86 ms ?QT/QTcB 396/481 ms ? ?Echo 07/08/20 demonstrated  ?1. Left ventricular ejection fraction, by estimation, is 55 to 60%. The  ?left ventricle has normal function. The left ventricle has no regional  ?wall motion abnormalities. Left ventricular diastolic parameters are  ?indeterminate.  ? 2. Right ventricular systolic function is normal. The right ventricular  ?size is normal.  ? 3. The mitral valve is normal in structure. No evidence of mitral valve  ?regurgitation. No evidence of mitral stenosis.  ? 4. The aortic valve was not well visualized. Aortic valve regurgitation  ?is not visualized. No aortic stenosis is present.  ? 5. The inferior vena cava is normal in size with greater than 50%  ?respiratory variability, suggesting right atrial pressure of 3 mmHg.  ? ?Epic records are reviewed at length today ? ?CHA2DS2-VASc Score = 2  ?The patient's score is based upon: ?CHF History: 0 ?HTN History: 1 ?Diabetes History: 0 ?Stroke History: 0 ?Vascular Disease History: 0 ?Age Score: 0 ?Gender Score: 1 ?    ? ? ?ASSESSMENT AND PLAN: ?1. Paroxysmal Atrial Fibrillation (ICD10:  I48.0) ?The patient's CHA2DS2-VASc score is 2,  indicating a 2.2% annual risk of stroke.   ?General education about afib provided and questions answered. We also discussed her stroke risk and the risks and benefits of anticoagulation. ?Increase carvedilol to 12.5 mg BID ?We discussed rhythm control options including AAD and ablation. Given her young age and paroxysmal nature of afib she would like to discuss ablation with EP, will refer.  ?Continue Eliquis 5 mg BID ? ?2. Obesity ?Body mass index is 38.75 kg/m?. ?Lifestyle modification was discussed at length including regular exercise and weight reduction. ? ?3. Suspected obstructive sleep apnea ?The importance of adequate treatment of sleep apnea was discussed today in order to improve our ability to maintain sinus rhythm long term. ?Sleep study pending.  ? ?4. HTN ?Stable, med changes as above.  ? ? ?Follow up with EP for evaluation.  ? ? ?Ricky Arlina Sabina PA-C ?[facility] ?[facility] ?[address]  [place] ?[place], [place] [address] ?[phone] ?09/28/2021 ?2:45 PM ? ?

## 2021-09-29 ENCOUNTER — Other Ambulatory Visit (HOSPITAL_COMMUNITY): Payer: Self-pay

## 2021-10-03 ENCOUNTER — Encounter: Payer: Self-pay | Admitting: *Deleted

## 2021-10-03 ENCOUNTER — Ambulatory Visit (INDEPENDENT_AMBULATORY_CARE_PROVIDER_SITE_OTHER): Payer: 59 | Admitting: Cardiology

## 2021-10-03 ENCOUNTER — Encounter: Payer: Self-pay | Admitting: Cardiology

## 2021-10-03 VITALS — BP 111/72 | HR 86 | Ht 67.0 in | Wt 253.0 lb

## 2021-10-03 DIAGNOSIS — I480 Paroxysmal atrial fibrillation: Secondary | ICD-10-CM

## 2021-10-03 DIAGNOSIS — I4891 Unspecified atrial fibrillation: Secondary | ICD-10-CM

## 2021-10-03 DIAGNOSIS — Z01818 Encounter for other preprocedural examination: Secondary | ICD-10-CM | POA: Diagnosis not present

## 2021-10-03 NOTE — Progress Notes (Signed)
? ?Electrophysiology Office Note ? ? ?Date:  10/03/2021  ? ?ID:  Eileen Buck, DOB 1995/09/03, MRN [id number] ? ?PCP:  Jettie Booze, NP  ?Cardiologist:  Croitrou ?Primary Electrophysiologist:  Serah Nicoletti Meredith Leeds, MD   ? ?Chief Complaint: AF ?  ?History of Present Illness: ?Eileen Buck is a 26 y.o. female who is being seen today for the evaluation of AF at the request of Fenton, Clint R, PA. Presenting today for electrophysiology evaluation. ? ?She has a history significant for hypertension and atrial fibrillation.  She was diagnosed with atrial fibrillation 3-30 123 after presenting to heart care with palpitations, clamminess, lightheadedness.  ECG showed rapid atrial fibrillation.  She was sent to the hospital for cardioversion but converted on her own.  She has a CHA2DS2-VASc of 2.  She has a sleep study pending. ? ?Today, she denies symptoms of chest pain, shortness of breath, orthopnea, PND, lower extremity edema, claudication, dizziness, presyncope, syncope, bleeding, or neurologic sequela. The patient is tolerating medications without difficulties.  He is continued to have episodic palpitations that she attributes to atrial fibrillation.  She has no chest pain, but does have shortness of breath and fatigue when she is in atrial fibrillation.  She would like a rhythm control strategy. ? ? ?Past Medical History:  ?Diagnosis Date  ? Anxiety   ? xanax in past  ? Headache   ? Hypertension   ? Nausea   ? Weakness   ? ?Past Surgical History:  ?Procedure Laterality Date  ? ANKLE SURGERY  04/2005  ? APPENDECTOMY  09/16/11  ? LAPAROSCOPIC APPENDECTOMY  09/16/2011  ? Procedure: APPENDECTOMY LAPAROSCOPIC;  Surgeon: Earnstine Regal, MD;  Location: [facility];  Service: General;  Laterality: N/A;  ? NASAL SINUS SURGERY  08/2010  ? ? ? ?Current Outpatient Medications  ?Medication Sig Dispense Refill  ? amLODipine (NORVASC) 5 MG tablet Take 1 tablet (5 mg total) by mouth daily. 90 tablet 3  ? apixaban (ELIQUIS) 5 MG TABS  tablet Take 1 tablet (5 mg total) by mouth 2 (two) times daily. 60 tablet 11  ? carvedilol (COREG) 12.5 MG tablet Take 1 tablet (12.5 mg total) by mouth 2 (two) times daily. 60 tablet 3  ? ondansetron (ZOFRAN) 8 MG tablet Take 1 tablet by mouth every 8  hours as needed for Nausea. 20 tablet 1  ? triamterene-hydrochlorothiazide (DYAZIDE) 37.5-25 MG capsule Take 1 capsule by mouth daily. 90 capsule 3  ? ?No current facility-administered medications for this visit.  ? ? ?Allergies:   Patient has no known allergies.  ? ?Social History:  The patient  reports that she has never smoked. She has never used smokeless tobacco. She reports that she does not drink alcohol and does not use drugs.  ? ?Family History:  The patient's family history includes Cancer in her paternal grandmother; Heart disease in her father; Hypertension in her mother.  ? ? ?ROS:  Please see the history of present illness.   Otherwise, review of systems is positive for none.   All other systems are reviewed and negative.  ? ? ?PHYSICAL EXAM: ?VS:  BP 111/72   Pulse 86   Ht 5\' 7"  (1.702 m)   Wt 253 lb (114.8 kg)   LMP 09/08/2021 (Exact Date)   BMI 39.63 kg/m?  , BMI Body mass index is 39.63 kg/m?. ?GEN: Well nourished, well developed, in no acute distress  ?HEENT: normal  ?Neck: no JVD, carotid bruits, or masses ?Cardiac: RRR; no murmurs, rubs,  or gallops,no edema  ?Respiratory:  clear to auscultation bilaterally, normal work of breathing ?GI: soft, nontender, nondistended, + BS ?MS: no deformity or atrophy  ?Skin: warm and dry ?Neuro:  Strength and sensation are intact ?Psych: euthymic mood, full affect ? ?EKG:  EKG is ordered today. ?Personal review of the ekg ordered shows sinus rhythm, rate 86 ? ?Recent Labs: ?09/22/2021: ALT 23; BUN 11; Creatinine, Ser 0.63; Hemoglobin 16.0; Platelets 292; Potassium 5.0; Sodium 142; TSH 1.390  ? ? ?Lipid Panel  ?No results found for: CHOL, TRIG, HDL, CHOLHDL, VLDL, LDLCALC, LDLDIRECT ? ? ?Wt Readings from Last  3 Encounters:  ?10/03/21 253 lb (114.8 kg)  ?09/28/21 247 lb 6.4 oz (112.2 kg)  ?09/22/21 245 lb (111.1 kg)  ?  ? ? ?Other studies Reviewed: ?Additional studies/ records that were reviewed today include: TTE 07/08/20  ?Review of the above records today demonstrates:  ? 1. Left ventricular ejection fraction, by estimation, is 55 to 60%. The  ?left ventricle has normal function. The left ventricle has no regional  ?wall motion abnormalities. Left ventricular diastolic parameters are  ?indeterminate.  ? 2. Right ventricular systolic function is normal. The right ventricular  ?size is normal.  ? 3. The mitral valve is normal in structure. No evidence of mitral valve  ?regurgitation. No evidence of mitral stenosis.  ? 4. The aortic valve was not well visualized. Aortic valve regurgitation  ?is not visualized. No aortic stenosis is present.  ? 5. The inferior vena cava is normal in size with greater than 50%  ?respiratory variability, suggesting right atrial pressure of 3 mmHg.  ? ? ?ASSESSMENT AND PLAN: ? ?1.  Paroxysmal atrial fibrillation: CHA2DS2-VASc of 2.  Currently on Eliquis 5 mg twice daily, carvedilol 12.5 mg twice daily.  He is unfortunately continued to have episodes of atrial fibrillation.  Due to that, she would prefer a rhythm control strategy.  She would like to avoid medications.  We Eileen Buck plan for ablation. ? ?Risk, benefits, and alternatives to EP study and radiofrequency ablation for afib were also discussed in detail today. These risks include but are not limited to stroke, bleeding, vascular damage, tamponade, perforation, damage to the esophagus, lungs, and other structures, pulmonary vein stenosis, worsening renal function, and death. The patient understands these risk and wishes to proceed.  We Eileen Buck therefore proceed with catheter ablation at the next available time.  Carto, ICE, anesthesia are requested for the procedure.  Eileen Buck also obtain CT PV protocol prior to the procedure to exclude LAA  thrombus and further evaluate atrial anatomy.  ? ?2.  Obesity: Body mass index is 39.63 kg/m?. ?Lifestyle modification with diet and exercise encouraged ? ?3.  Snoring: Has sleep study pending. ? ?4.  Hypertension: Currently well controlled ? ?Case discussed with primary cardiology ? ?Current medicines are reviewed at length with the patient today.   ?The patient does not have concerns regarding her medicines.  The following changes were made today:  none ? ?Labs/ tests ordered today include:  ?Orders Placed This Encounter  ?Procedures  ? CT CARDIAC MORPH/PULM VEIN W/CM&W/O CA SCORE  ? CBC w/Diff  ? Basic Metabolic Panel (BMET)  ? EKG 12-Lead  ? ? ? ?Disposition:   FU with Eileen Buck 3 months ? ?Signed, ?Eileen Stare Meredith Leeds, MD  ?10/03/2021 12:04 PM    ? ?[facility] ?[address] ?[address] ?[place] [address] ?[phone] (office) ?[phone] (fax) ? ?

## 2021-10-03 NOTE — Patient Instructions (Addendum)
Medication Instructions:  ?Your physician recommends that you continue on your current medications as directed. Please refer to the Current Medication list given to you today. ?*If you need a refill on your cardiac medications before your next appointment, please call your pharmacy* ? ?Lab Work: ?None. ?If you have labs (blood work) drawn today and your tests are completely normal, you will receive your results only by: ?MyChart Message (if you have MyChart) OR ?A paper copy in the mail ?If you have any lab test that is abnormal or we need to change your treatment, we will call you to review the results. ? ?Testing/Procedures: ?Your physician has requested that you have cardiac CT. Cardiac computed tomography (CT) is a painless test that uses an x-ray machine to take clear, detailed pictures of your heart. For further information please visit [email or web address]. Please follow instruction sheet as given. ?  ?Your physician has recommended that you have an ablation. Catheter ablation is a medical procedure used to treat some cardiac arrhythmias (irregular heartbeats). During catheter ablation, a long, thin, flexible tube is put into a blood vessel in your groin (upper thigh), or neck. This tube is called an ablation catheter. It is then guided to your heart through the blood vessel. Radio frequency waves destroy small areas of heart tissue where abnormal heartbeats may cause an arrhythmia to start. Please see the instruction sheet given to you today. ? ? ?Follow-Up: ?At [facility], you and your health needs are our priority.  As part of our continuing mission to provide you with exceptional heart care, we have created designated Provider Care Teams.  These Care Teams include your primary Cardiologist (physician) and Advanced Practice Providers (APPs -  Physician Assistants and Nurse Practitioners) who all work together to provide you with the care you need, when you need it. ? ?Your physician wants you to  follow-up in: See instruction letter.  ? ?We recommend signing up for the patient portal called "MyChart".  Sign up information is provided on this After Visit Summary.  MyChart is used to connect with patients for Virtual Visits (Telemedicine).  Patients are able to view lab/test results, encounter notes, upcoming appointments, etc.  Non-urgent messages can be sent to your provider as well.   ?To learn more about what you can do with MyChart, go to [email or web address].   ? ?Any Other Special Instructions Will Be Listed Below (If Applicable). ? ?Cardiac Ablation ?Cardiac ablation is a procedure to destroy (ablate) some heart tissue that is sending bad signals. These bad signals cause problems in heart rhythm. ?The heart has many areas that make these signals. If there are problems in these areas, they can make the heart beat in a way that is not normal. Destroying some tissues can help make the heart rhythm normal. ?Tell your doctor about: ?Any allergies you have. ?All medicines you are taking. These include vitamins, herbs, eye drops, creams, and over-the-counter medicines. ?Any problems you or family members have had with medicines that make you fall asleep (anesthetics). ?Any blood disorders you have. ?Any surgeries you have had. ?Any medical conditions you have, such as kidney failure. ?Whether you are pregnant or may be pregnant. ?What are the risks? ?This is a safe procedure. But problems may occur, including: ?Infection. ?Bruising and bleeding. ?Bleeding into the chest. ?Stroke or blood clots. ?Damage to nearby areas of your body. ?Allergies to medicines or dyes. ?The need for a pacemaker if the normal system is damaged. ?Failure of the procedure to treat the problem. ?  What happens before the procedure? ?Medicines ?Ask your doctor about: ?Changing or stopping your normal medicines. This is important. ?Taking aspirin and ibuprofen. Do not take these medicines unless your doctor tells you to take  them. ?Taking other medicines, vitamins, herbs, and supplements. ?General instructions ?Follow instructions from your doctor about what you cannot eat or drink. ?Plan to have someone take you home from the hospital or clinic. ?If you will be going home right after the procedure, plan to have someone with you for 24 hours. ?Ask your doctor what steps will be taken to prevent infection. ?What happens during the procedure? ? ?An IV tube will be put into one of your veins. ?You will be given a medicine to help you relax. ?The skin on your neck or groin will be numbed. ?A cut (incision) will be made in your neck or groin. A needle will be put through your cut and into a large vein. ?A tube (catheter) will be put into the needle. The tube will be moved to your heart. ?Dye may be put through the tube. This helps your doctor see your heart. ?Small devices (electrodes) on the tube will send out signals. ?A type of energy will be used to destroy some heart tissue. ?The tube will be taken out. ?Pressure will be held on your cut. This helps stop bleeding. ?A bandage will be put over your cut. ?The exact procedure may vary among doctors and hospitals. ?What happens after the procedure? ?You will be watched until you leave the hospital or clinic. This includes checking your heart rate, breathing rate, oxygen, and blood pressure. ?Your cut will be watched for bleeding. You will need to lie still for a few hours. ?Do not drive for 24 hours or as long as your doctor tells you. ?Summary ?Cardiac ablation is a procedure to destroy some heart tissue. This is done to treat heart rhythm problems. ?Tell your doctor about any medical conditions you may have. Tell him or her about all medicines you are taking to treat them. ?This is a safe procedure. But problems may occur. These include infection, bruising, bleeding, and damage to nearby areas of your body. ?Follow what your doctor tells you about food and drink. You may also be told to  change or stop some of your medicines. ?After the procedure, do not drive for 24 hours or as long as your doctor tells you. ?This information is not intended to replace advice given to you by your health care provider. Make sure you discuss any questions you have with your health care provider. ?Document Revised: 05/14/2019 Document Reviewed: 05/14/2019 ?Elsevier Patient Education ? 2022 [place]. ? ? ? ?  ? ? ?

## 2021-11-07 ENCOUNTER — Ambulatory Visit (HOSPITAL_BASED_OUTPATIENT_CLINIC_OR_DEPARTMENT_OTHER): Payer: 59 | Attending: Cardiovascular Disease | Admitting: Cardiovascular Disease

## 2021-11-07 DIAGNOSIS — I4891 Unspecified atrial fibrillation: Secondary | ICD-10-CM | POA: Diagnosis not present

## 2021-11-07 DIAGNOSIS — R0902 Hypoxemia: Secondary | ICD-10-CM | POA: Diagnosis not present

## 2021-11-07 DIAGNOSIS — R0683 Snoring: Secondary | ICD-10-CM | POA: Diagnosis not present

## 2021-11-07 DIAGNOSIS — G478 Other sleep disorders: Secondary | ICD-10-CM | POA: Insufficient documentation

## 2021-11-16 ENCOUNTER — Telehealth: Payer: Self-pay | Admitting: *Deleted

## 2021-11-16 ENCOUNTER — Other Ambulatory Visit (HOSPITAL_COMMUNITY): Payer: Self-pay

## 2021-11-16 ENCOUNTER — Encounter (HOSPITAL_BASED_OUTPATIENT_CLINIC_OR_DEPARTMENT_OTHER): Payer: Self-pay | Admitting: Cardiovascular Disease

## 2021-11-16 NOTE — Telephone Encounter (Signed)
Patient notified of her sleep study results. She had no questions or concerns.

## 2021-11-16 NOTE — Telephone Encounter (Signed)
-----   Message from [name], MD sent at 11/16/2021  8:08 AM EDT ----- Eileen Buck, please notify pt of results.

## 2021-11-16 NOTE — Procedures (Signed)
Patient Name: Barbetta, Ferone Date: 11/07/2021 Gender: Female D.O.B: [birth year] Age (years): 26 Referring Provider: Dani Gobble Croitoru Height (inches): 67 Interpreting Physician: Shelva Majestic MD, ABSM Weight (lbs): 255 RPSGT: Laren Everts BMI: 40 MRN: [id number] Neck Size: 15.75  CLINICAL INFORMATION Sleep Study Type: NPSG  Indication for sleep study: Fatigue, Hypertension, Obesity, PAF  Epworth Sleepiness Score: 1  SLEEP STUDY TECHNIQUE As per the AASM Manual for the Scoring of Sleep and Associated Events v2.3 (April 2016) with a hypopnea requiring 4% desaturations.  The channels recorded and monitored were frontal, central and occipital EEG, electrooculogram (EOG), submentalis EMG (chin), nasal and oral airflow, thoracic and abdominal wall motion, anterior tibialis EMG, snore microphone, electrocardiogram, and pulse oximetry.  MEDICATIONS amLODipine (NORVASC) 5 MG tablet apixaban (ELIQUIS) 5 MG TABS tablet carvedilol (COREG) 12.5 MG tablet ondansetron (ZOFRAN) 8 MG tablet triamterene-hydrochlorothiazide (DYAZIDE) 37.5-25 MG capsule  Medications self-administered by patient taken the night of the study : N/A  SLEEP ARCHITECTURE The study was initiated at 10:02:41 PM and ended at 5:20:04 AM.  Sleep onset time was 6.8 minutes and the sleep efficiency was 90.4%%. The total sleep time was 395.5 minutes.  Stage REM latency was 102.5 minutes.  The patient spent 7.6%% of the night in stage N1 sleep, 50.1%% in stage N2 sleep, 7.6%% in stage N3 and 34.8% in REM.  Alpha intrusion was absent.  Supine sleep was 18.33%.  RESPIRATORY PARAMETERS The overall apnea/hypopnea index (AHI) was 3.3 per hour. The respiratory disturbance index (RDI) was 5.0/h. There were 0 total apneas, including 0 obstructive, 0 central and 0 mixed apneas. There were 22 hypopneas and 11 RERAs.  The AHI during Stage REM sleep was 7.4 per hour.  AHI while supine was 13.2 per  hour.  The mean oxygen saturation was 94.8%. The minimum SpO2 during sleep was 84.0%.  Soft snoring was noted during this study.  CARDIAC DATA The 2 lead EKG demonstrated sinus rhythm. The mean heart rate was 80.4 beats per minute. Other EKG findings include: None.  LEG MOVEMENT DATA The total PLMS were 0 with a resulting PLMS index of 0.0. Associated arousal with leg movement index was 0.0 .  IMPRESSIONS - Increased upper airway resistance (UARS) without significant obstructive sleep apnea overall (AHI  3.3/h; RDI 5.0/h); however, mild sleep apnea was present during REM sleep (AHI 7.4/h) and with supine sleep (AHI 13.2/h). - Mild oxygen desaturation to a nadir of 84.0% during REM sleep. Time spent < 89% was 1.4 minutes. - The patient snored with soft snoring volume. - No cardiac abnormalities were noted during this study. - Clinically significant periodic limb movements did not occur during sleep. No significant associated arousals.  DIAGNOSIS - Increased upper airway resistance syndrome (UARS) - Nocturnal Hypoxemia (G47.36)  RECOMMENDATIONS - At present patient does not meet criteria for CPAP therapy. - Effort should be made to optimize nasal and oropharyngeal patency. - Consider alternative for treatment of mild snoring. - The patient should be cautioned to avoid supine sleep; consider positional therapy avoiding supine position during sleep. - Avoid alcohol, sedatives and other CNS depressants that may worsen sleep apnea and disrupt normal sleep architecture. - Sleep hygiene should be reviewed to assess factors that may improve sleep quality. - Weight management (BMI 40) and regular exercise should be initiated or continued if appropriate.  [Electronically signed] 11/16/2021 08:02 AM  Shelva Majestic MD, [facility], [place], American Board of Sleep Medicine  NPI: [id number]  [place]  PH: [phone]   FX: [phone] [place]

## 2021-12-20 ENCOUNTER — Ambulatory Visit: Payer: 59 | Admitting: Cardiovascular Disease

## 2021-12-27 ENCOUNTER — Other Ambulatory Visit: Payer: 59

## 2022-01-01 ENCOUNTER — Other Ambulatory Visit: Payer: Self-pay

## 2022-01-01 DIAGNOSIS — I480 Paroxysmal atrial fibrillation: Secondary | ICD-10-CM

## 2022-01-01 DIAGNOSIS — Z01818 Encounter for other preprocedural examination: Secondary | ICD-10-CM

## 2022-01-01 DIAGNOSIS — I4891 Unspecified atrial fibrillation: Secondary | ICD-10-CM

## 2022-01-02 LAB — BASIC METABOLIC PANEL
BUN/Creatinine Ratio: 21 (ref 9–23)
BUN: 15 mg/dL (ref 6–20)
CO2: 25 mmol/L (ref 20–29)
Calcium: 9.6 mg/dL (ref 8.7–10.2)
Chloride: 102 mmol/L (ref 96–106)
Creatinine, Ser: 0.72 mg/dL (ref 0.57–1.00)
Glucose: 97 mg/dL (ref 70–99)
Potassium: 3.9 mmol/L (ref 3.5–5.2)
Sodium: 139 mmol/L (ref 134–144)
eGFR: 117 mL/min/{1.73_m2} (ref >59)

## 2022-01-02 LAB — CBC WITH DIFFERENTIAL/PLATELET
Basophils Absolute: 0.1 10*3/uL (ref 0.0–0.2)
Basos: 1 %
EOS (ABSOLUTE): 0.2 10*3/uL (ref 0.0–0.4)
Eos: 3 %
Hematocrit: 43.7 % (ref 34.0–46.6)
Hemoglobin: 14.3 g/dL (ref 11.1–15.9)
Immature Grans (Abs): 0 10*3/uL (ref 0.0–0.1)
Immature Granulocytes: 0 %
Lymphocytes Absolute: 3.4 10*3/uL — ABNORMAL HIGH (ref 0.7–3.1)
Lymphs: 37 %
MCH: 28.8 pg (ref 26.6–33.0)
MCHC: 32.7 g/dL (ref 31.5–35.7)
MCV: 88 fL (ref 79–97)
Monocytes Absolute: 0.7 10*3/uL (ref 0.1–0.9)
Monocytes: 8 %
Neutrophils Absolute: 4.8 10*3/uL (ref 1.4–7.0)
Neutrophils: 51 %
Platelets: 251 10*3/uL (ref 150–450)
RBC: 4.97 x10E6/uL (ref 3.77–5.28)
RDW: 12.6 % (ref 11.7–15.4)
WBC: 9.2 10*3/uL (ref 3.4–10.8)

## 2022-01-09 ENCOUNTER — Telehealth (HOSPITAL_COMMUNITY): Payer: Self-pay | Admitting: Emergency Medicine

## 2022-01-09 NOTE — Telephone Encounter (Signed)
Attempted to call patient regarding upcoming cardiac CT appointment. °Left message on voicemail with name and callback number °Torsha Lemus RN Navigator Cardiac Imaging °[facility] °[phone] Office °[phone] Cell ° °

## 2022-01-10 ENCOUNTER — Ambulatory Visit (HOSPITAL_COMMUNITY): Payer: 59

## 2022-01-10 ENCOUNTER — Ambulatory Visit (HOSPITAL_COMMUNITY)
Admission: RE | Admit: 2022-01-10 | Discharge: 2022-01-10 | Disposition: A | Discharge status: Home / Self Care | Payer: 59 | Source: Ambulatory Visit | Attending: Cardiology | Admitting: Cardiology

## 2022-01-10 DIAGNOSIS — I4891 Unspecified atrial fibrillation: Secondary | ICD-10-CM | POA: Insufficient documentation

## 2022-01-10 MED ORDER — IOHEXOL 350 MG/ML SOLN
100.0000 mL | Freq: Once | INTRAVENOUS | Status: AC | PRN
  Administered 2022-01-10: 100 mL via INTRAVENOUS

## 2022-01-10 MED ORDER — METOPROLOL TARTRATE 5 MG/5ML IV SOLN
INTRAVENOUS | Status: AC
  Filled 2022-01-10: qty 10

## 2022-01-16 NOTE — Pre-Procedure Instructions (Signed)
Attempted to call patient regarding procedure instructions.  Left voice mail on the following items: Arrival time 0930 Nothing to eat or drink after midnight No meds AM of procedure Responsible person to drive you home and stay with you for 24 hrs  Have you missed any doses of anti-coagulant Eliquis- take both doses today, if you have missed any doses please let us know

## 2022-01-17 ENCOUNTER — Ambulatory Visit (HOSPITAL_COMMUNITY)
Admission: RE | Admit: 2022-01-17 | Discharge: 2022-01-17 | Disposition: A | Discharge status: Home / Self Care | Payer: 59 | Source: Ambulatory Visit | Attending: Cardiology | Admitting: Cardiology

## 2022-01-17 ENCOUNTER — Ambulatory Visit (HOSPITAL_BASED_OUTPATIENT_CLINIC_OR_DEPARTMENT_OTHER): Payer: 59 | Admitting: Anesthesiology

## 2022-01-17 ENCOUNTER — Encounter (HOSPITAL_COMMUNITY)
Admission: RE | Disposition: A | Discharge status: Home / Self Care | Payer: 59 | Source: Ambulatory Visit | Attending: Cardiology

## 2022-01-17 ENCOUNTER — Ambulatory Visit (HOSPITAL_COMMUNITY): Payer: 59 | Admitting: Anesthesiology

## 2022-01-17 ENCOUNTER — Other Ambulatory Visit: Payer: Self-pay

## 2022-01-17 ENCOUNTER — Encounter (HOSPITAL_COMMUNITY): Payer: Self-pay | Admitting: Cardiology

## 2022-01-17 DIAGNOSIS — I4811 Longstanding persistent atrial fibrillation: Secondary | ICD-10-CM

## 2022-01-17 DIAGNOSIS — I4891 Unspecified atrial fibrillation: Secondary | ICD-10-CM | POA: Diagnosis not present

## 2022-01-17 DIAGNOSIS — I480 Paroxysmal atrial fibrillation: Secondary | ICD-10-CM | POA: Insufficient documentation

## 2022-01-17 DIAGNOSIS — F419 Anxiety disorder, unspecified: Secondary | ICD-10-CM

## 2022-01-17 DIAGNOSIS — I10 Essential (primary) hypertension: Secondary | ICD-10-CM | POA: Diagnosis not present

## 2022-01-17 HISTORY — PX: ATRIAL FIBRILLATION ABLATION: EP1191

## 2022-01-17 LAB — POCT ACTIVATED CLOTTING TIME
Activated Clotting Time: 335 seconds
Activated Clotting Time: 377 seconds

## 2022-01-17 LAB — PREGNANCY, URINE: Preg Test, Ur: NEGATIVE

## 2022-01-17 SURGERY — ATRIAL FIBRILLATION ABLATION
Anesthesia: General

## 2022-01-17 MED ORDER — PROPOFOL 10 MG/ML IV BOLUS
INTRAVENOUS | Status: DC | PRN
  Administered 2022-01-17: 200 mg via INTRAVENOUS

## 2022-01-17 MED ORDER — SODIUM CHLORIDE 0.9% FLUSH
3.0000 mL | INTRAVENOUS | Status: DC | PRN
Start: 2022-01-17 — End: 2022-01-17

## 2022-01-17 MED ORDER — DEXAMETHASONE SODIUM PHOSPHATE 10 MG/ML IJ SOLN
INTRAMUSCULAR | Status: DC | PRN
  Administered 2022-01-17: 10 mg via INTRAVENOUS

## 2022-01-17 MED ORDER — HEPARIN (PORCINE) IN NACL 1000-0.9 UT/500ML-% IV SOLN
INTRAVENOUS | Status: AC
  Filled 2022-01-17: qty 500

## 2022-01-17 MED ORDER — HEPARIN SODIUM (PORCINE) 1000 UNIT/ML IJ SOLN
INTRAMUSCULAR | Status: DC | PRN
  Administered 2022-01-17: 1000 [IU] via INTRAVENOUS

## 2022-01-17 MED ORDER — ROCURONIUM BROMIDE 10 MG/ML (PF) SYRINGE
PREFILLED_SYRINGE | INTRAVENOUS | Status: DC | PRN
  Administered 2022-01-17: 100 mg via INTRAVENOUS

## 2022-01-17 MED ORDER — HEPARIN (PORCINE) IN NACL 1000-0.9 UT/500ML-% IV SOLN
INTRAVENOUS | Status: DC | PRN
  Administered 2022-01-17 (×4): 500 mL

## 2022-01-17 MED ORDER — ACETAMINOPHEN 325 MG PO TABS
ORAL_TABLET | ORAL | Status: AC
  Filled 2022-01-17: qty 2

## 2022-01-17 MED ORDER — HEPARIN SODIUM (PORCINE) 1000 UNIT/ML IJ SOLN
INTRAMUSCULAR | Status: DC | PRN
  Administered 2022-01-17: 15000 [IU] via INTRAVENOUS

## 2022-01-17 MED ORDER — SUGAMMADEX SODIUM 200 MG/2ML IV SOLN
INTRAVENOUS | Status: DC | PRN
  Administered 2022-01-17: 200 mg via INTRAVENOUS

## 2022-01-17 MED ORDER — LIDOCAINE 2% (20 MG/ML) 5 ML SYRINGE
INTRAMUSCULAR | Status: DC | PRN
Start: 2022-01-17 — End: 2022-01-17
  Administered 2022-01-17: 80 mg via INTRAVENOUS

## 2022-01-17 MED ORDER — ACETAMINOPHEN 325 MG PO TABS
650.0000 mg | ORAL_TABLET | ORAL | Status: DC | PRN
Start: 2022-01-17 — End: 2022-01-17
  Administered 2022-01-17: 650 mg via ORAL

## 2022-01-17 MED ORDER — SODIUM CHLORIDE 0.9 % IV SOLN: INTRAVENOUS | Status: DC

## 2022-01-17 MED ORDER — SODIUM CHLORIDE 0.9% FLUSH
3.0000 mL | Freq: Two times a day (BID) | INTRAVENOUS | Status: DC
Start: 2022-01-17 — End: 2022-01-17

## 2022-01-17 MED ORDER — HEPARIN SODIUM (PORCINE) 1000 UNIT/ML IJ SOLN
INTRAMUSCULAR | Status: AC
  Filled 2022-01-17: qty 10

## 2022-01-17 MED ORDER — ONDANSETRON HCL 4 MG/2ML IJ SOLN: 4.0000 mg | Freq: Four times a day (QID) | INTRAMUSCULAR | Status: DC | PRN

## 2022-01-17 MED ORDER — ONDANSETRON HCL 4 MG/2ML IJ SOLN
INTRAMUSCULAR | Status: DC | PRN
  Administered 2022-01-17: 4 mg via INTRAVENOUS

## 2022-01-17 MED ORDER — FENTANYL CITRATE (PF) 250 MCG/5ML IJ SOLN
INTRAMUSCULAR | Status: DC | PRN
  Administered 2022-01-17: 100 ug via INTRAVENOUS

## 2022-01-17 MED ORDER — SODIUM CHLORIDE 0.9 % IV SOLN: 250.0000 mL | INTRAVENOUS | Status: DC | PRN

## 2022-01-17 MED ORDER — MIDAZOLAM HCL 5 MG/5ML IJ SOLN
INTRAMUSCULAR | Status: DC | PRN
  Administered 2022-01-17: 2 mg via INTRAVENOUS

## 2022-01-17 MED ORDER — PROTAMINE SULFATE 10 MG/ML IV SOLN
INTRAVENOUS | Status: DC | PRN
  Administered 2022-01-17: 40 mg via INTRAVENOUS

## 2022-01-17 SURGICAL SUPPLY — 19 items
CATH 8FR REPROCESSED SOUNDSTAR (CATHETERS) ×2 IMPLANT
CATH 8FR SOUNDSTAR REPROCESSED (CATHETERS) IMPLANT
CATH ABLAT QDOT MICRO BI TC DF (CATHETERS) ×1 IMPLANT
CATH OCTARAY 2.0 F 3-3-3-3-3 (CATHETERS) ×1 IMPLANT
CATH S CIRCA THERM PROBE 10F (CATHETERS) ×1 IMPLANT
CATH WEB BI DIR CSDF CRV REPRO (CATHETERS) ×1 IMPLANT
CLOSURE PERCLOSE PROSTYLE (VASCULAR PRODUCTS) ×4 IMPLANT
COVER SWIFTLINK CONNECTOR (BAG) ×2 IMPLANT
KIT VERSACROSS STEERABLE D1 (CATHETERS) ×1 IMPLANT
MAT PREVALON FULL STRYKER (MISCELLANEOUS) ×1 IMPLANT
PACK EP LATEX FREE (CUSTOM PROCEDURE TRAY) ×2
PACK EP LF (CUSTOM PROCEDURE TRAY) ×1 IMPLANT
PAD DEFIB RADIO PHYSIO CONN (PAD) ×2 IMPLANT
PATCH CARTO3 (PAD) ×1 IMPLANT
SHEATH CARTO VIZIGO SM CVD (SHEATH) ×1 IMPLANT
SHEATH PINNACLE 7F 10CM (SHEATH) ×1 IMPLANT
SHEATH PINNACLE 8F 10CM (SHEATH) ×2 IMPLANT
SHEATH PINNACLE 9F 10CM (SHEATH) ×1 IMPLANT
TUBING SMART ABLATE COOLFLOW (TUBING) ×1 IMPLANT

## 2022-01-17 NOTE — Anesthesia Preprocedure Evaluation (Addendum)
Anesthesia Evaluation  Patient identified by MRN, date of birth, ID band Patient awake    Reviewed: Allergy & Precautions, NPO status , Patient's Chart, lab work & pertinent test results, reviewed documented beta blocker date and time   History of Anesthesia Complications Negative for: history of anesthetic complications  Airway Mallampati: III  TM Distance: >3 FB Neck ROM: Full    Dental  (+) Teeth Intact   Pulmonary neg pulmonary ROS, neg recent URI,    breath sounds clear to auscultation       Cardiovascular hypertension, Pt. on medications and Pt. on home beta blockers (-) angina(-) Past MI and (-) CHF  Rhythm:Regular  1. Left ventricular ejection fraction, by estimation, is 55 to 60%. The  left ventricle has normal function. The left ventricle has no regional  wall motion abnormalities. Left ventricular diastolic parameters are  indeterminate.  2. Right ventricular systolic function is normal. The right ventricular  size is normal.  3. The mitral valve is normal in structure. No evidence of mitral valve  regurgitation. No evidence of mitral stenosis.  4. The aortic valve was not well visualized. Aortic valve regurgitation  is not visualized. No aortic stenosis is present.  5. The inferior vena cava is normal in size with greater than 50%  respiratory variability, suggesting right atrial pressure of 3 mmHg.    Neuro/Psych  Headaches, neg Seizures PSYCHIATRIC DISORDERS Anxiety    GI/Hepatic negative GI ROS, Neg liver ROS,   Endo/Other  negative endocrine ROS  Renal/GU negative Renal ROS     Musculoskeletal negative musculoskeletal ROS (+)   Abdominal   Peds  Hematology  (+) Blood dyscrasia, , Lab Results      Component                Value               Date                      WBC                      9.2                 01/01/2022                HGB                      14.3                01/01/2022                 HCT                      43.7                01/01/2022                MCV                      88                  01/01/2022                PLT                      251  01/01/2022            eliquis   Anesthesia Other Findings   Reproductive/Obstetrics Lab Results      Component                Value               Date                      PREGTESTUR               NEGATIVE            01/17/2022                HCG                      11.5 (H)            11/12/2020                                       Anesthesia Physical Anesthesia Plan  ASA: 3  Anesthesia Plan: General   Post-op Pain Management: Minimal or no pain anticipated   Induction: Intravenous  PONV Risk Score and Plan: 3 and Ondansetron and Dexamethasone  Airway Management Planned: Oral ETT  Additional Equipment: None  Intra-op Plan:   Post-operative Plan: Extubation in OR  Informed Consent: I have reviewed the patients History and Physical, chart, labs and discussed the procedure including the risks, benefits and alternatives for the proposed anesthesia with the patient or authorized representative who has indicated his/her understanding and acceptance.     Dental advisory given  Plan Discussed with: CRNA  Anesthesia Plan Comments:         Anesthesia Quick Evaluation

## 2022-01-17 NOTE — H&P (Signed)
Electrophysiology Office Note   Date:  01/17/2022   ID:  Eileen Buck, DOB 1996/01/05, MRN [id number]  PCP:  April Manson, NP  Cardiologist:  [place] Primary Electrophysiologist:  Miarose Lippert Jorja Loa, MD    Chief Complaint: AF   History of Present Illness: Eileen Buck is a 26 y.o. female who is being seen today for the evaluation of AF at the request of No ref. provider found. Presenting today for electrophysiology evaluation.  She has a history significant for hypertension and atrial fibrillation.  She was diagnosed with atrial fibrillation 3-30 123 after presenting to heart care with palpitations, clamminess, lightheadedness.  ECG showed rapid atrial fibrillation.  She was sent to the hospital for cardioversion but converted on her own.  She has a CHA2DS2-VASc of 2.  She has a sleep study pending.  Today, denies symptoms of palpitations, chest pain, shortness of breath, orthopnea, PND, lower extremity edema, claudication, dizziness, presyncope, syncope, bleeding, or neurologic sequela. The patient is tolerating medications without difficulties. Plan ablation today.    Past Medical History:  Diagnosis Date   Anxiety    xanax in past   Headache    Hypertension    Nausea    Weakness    Past Surgical History:  Procedure Laterality Date   ANKLE SURGERY  04/2005   APPENDECTOMY  09/16/11   LAPAROSCOPIC APPENDECTOMY  09/16/2011   Procedure: APPENDECTOMY LAPAROSCOPIC;  Surgeon: Velora Heckler, MD;  Location: [facility];  Service: General;  Laterality: N/A;   NASAL SINUS SURGERY  08/2010     Current Facility-Administered Medications  Medication Dose Route Frequency Provider Last Rate Last Admin   0.9 %  sodium chloride infusion   Intravenous Continuous Regan Lemming, MD 50 mL/hr at 01/17/22 1010 New Bag at 01/17/22 1010    Allergies:   Patient has no known allergies.   Social History:  The patient  reports that she has never smoked. She has never used smokeless  tobacco. She reports that she does not drink alcohol and does not use drugs.   Family History:  The patient's family history includes Cancer in her paternal grandmother; Heart disease in her father; Hypertension in her mother.   ROS:  Please see the history of present illness.   Otherwise, review of systems is positive for none.   All other systems are reviewed and negative.   PHYSICAL EXAM: VS:  BP 137/82   Pulse 89   Ht 5\' 7"  (1.702 m)   Wt 115.7 kg   LMP 12/24/2021 (Exact Date) Comment: 7/2-4/23  SpO2 98%   BMI 39.94 kg/m  , BMI Body mass index is 39.94 kg/m. GEN: Well nourished, well developed, in no acute distress  HEENT: normal  Neck: no JVD, carotid bruits, or masses Cardiac: RRR; no murmurs, rubs, or gallops,no edema  Respiratory:  clear to auscultation bilaterally, normal work of breathing GI: soft, nontender, nondistended, + BS MS: no deformity or atrophy  Skin: warm and dry Neuro:  Strength and sensation are intact Psych: euthymic mood, full affect  Recent Labs: 09/22/2021: ALT 23; TSH 1.390 01/01/2022: BUN 15; Creatinine, Ser 0.72; Hemoglobin 14.3; Platelets 251; Potassium 3.9; Sodium 139    Lipid Panel  No results found for: "CHOL", "TRIG", "HDL", "CHOLHDL", "VLDL", "LDLCALC", "LDLDIRECT"   Wt Readings from Last 3 Encounters:  01/17/22 115.7 kg  11/07/21 115.7 kg  10/03/21 114.8 kg      Other studies Reviewed: Additional studies/ records that were reviewed today include: TTE  07/08/20  Review of the above records today demonstrates:   1. Left ventricular ejection fraction, by estimation, is 55 to 60%. The  left ventricle has normal function. The left ventricle has no regional  wall motion abnormalities. Left ventricular diastolic parameters are  indeterminate.   2. Right ventricular systolic function is normal. The right ventricular  size is normal.   3. The mitral valve is normal in structure. No evidence of mitral valve  regurgitation. No evidence of  mitral stenosis.   4. The aortic valve was not well visualized. Aortic valve regurgitation  is not visualized. No aortic stenosis is present.   5. The inferior vena cava is normal in size with greater than 50%  respiratory variability, suggesting right atrial pressure of 3 mmHg.    ASSESSMENT AND PLAN:  1.  Paroxysmal atrial fibrillation: Eileen Buck has presented today for surgery, with the diagnosis of AF.  The various methods of treatment have been discussed with the patient and family. After consideration of risks, benefits and other options for treatment, the patient has consented to  Procedure(s): Catheter ablation as a surgical intervention .  Risks include but not limited to complete heart block, stroke, esophageal damage, nerve damage, bleeding, vascular damage, tamponade, perforation, MI, and death. The patient's history has been reviewed, patient examined, no change in status, stable for surgery.  I have reviewed the patient's chart and labs.  Questions were answered to the patient's satisfaction.    Eileen Hardgrave Elberta Fortis, MD 01/17/2022 10:27 AM

## 2022-01-17 NOTE — Anesthesia Postprocedure Evaluation (Signed)
Anesthesia Post Note  Patient: Eileen Buck  Procedure(s) Performed: ATRIAL FIBRILLATION ABLATION     Patient location during evaluation: PACU Anesthesia Type: General Level of consciousness: awake and alert Pain management: pain level controlled Vital Signs Assessment: post-procedure vital signs reviewed and stable Respiratory status: spontaneous breathing, nonlabored ventilation and respiratory function stable Cardiovascular status: blood pressure returned to baseline and stable Postop Assessment: no apparent nausea or vomiting Anesthetic complications: no   There were no known notable events for this encounter.  Last Vitals:  Vitals:   01/17/22 1449 01/17/22 1500  BP:  132/90  Pulse: (!) 107 (!) 110  Resp: 18 18  Temp:    SpO2: 95% 97%    Last Pain:  Vitals:   01/17/22 1449  TempSrc:   PainSc: Asleep                 Telesforo Brosnahan

## 2022-01-17 NOTE — Transfer of Care (Signed)
Immediate Anesthesia Transfer of Care Note  Patient: Eileen Buck  Procedure(s) Performed: ATRIAL FIBRILLATION ABLATION  Patient Location: Cath Lab  Anesthesia Type:General  Level of Consciousness: drowsy  Airway & Oxygen Therapy: Patient Spontanous Breathing and Patient connected to nasal cannula oxygen  Post-op Assessment: Report given to RN, Post -op Vital signs reviewed and stable and Patient moving all extremities X 4  Post vital signs: Reviewed and stable  Last Vitals:  Vitals Value Taken Time  BP 158/87 01/17/22 1250  Temp 36.4 C 01/17/22 1251  Pulse 110 01/17/22 1254  Resp 24 01/17/22 1254  SpO2 98 % 01/17/22 1254  Vitals shown include unvalidated device data.  Last Pain:  Vitals:   01/17/22 1251  TempSrc: Temporal  PainSc: Asleep         Complications: There were no known notable events for this encounter.

## 2022-01-17 NOTE — Discharge Instructions (Signed)

## 2022-01-17 NOTE — Anesthesia Procedure Notes (Signed)
Procedure Name: Intubation Date/Time: 01/17/2022 11:11 AM  Performed by: Waynard Edwards, CRNAPre-anesthesia Checklist: Patient identified, Emergency Drugs available, Suction available and Patient being monitored Patient Re-evaluated:Patient Re-evaluated prior to induction Oxygen Delivery Method: Circle system utilized Preoxygenation: Pre-oxygenation with 100% oxygen Induction Type: IV induction Ventilation: Mask ventilation without difficulty Laryngoscope Size: 2 and Miller Grade View: Grade I Tube type: Oral Tube size: 7.0 mm Number of attempts: 1 Airway Equipment and Method: Stylet Placement Confirmation: ETT inserted through vocal cords under direct vision, positive ETCO2 and breath sounds checked- equal and bilateral Secured at: 22 cm Tube secured with: Tape Dental Injury: Teeth and Oropharynx as per pre-operative assessment

## 2022-01-18 ENCOUNTER — Encounter (HOSPITAL_COMMUNITY): Payer: Self-pay | Admitting: Cardiology

## 2022-01-26 ENCOUNTER — Other Ambulatory Visit (HOSPITAL_COMMUNITY): Payer: Self-pay

## 2022-02-08 ENCOUNTER — Ambulatory Visit (HOSPITAL_COMMUNITY): Payer: 59 | Admitting: Physician Assistant

## 2022-02-09 ENCOUNTER — Encounter (HOSPITAL_COMMUNITY): Payer: Self-pay | Admitting: Physician Assistant

## 2022-02-09 ENCOUNTER — Ambulatory Visit (HOSPITAL_COMMUNITY)
Admission: RE | Admit: 2022-02-09 | Discharge: 2022-02-09 | Disposition: A | Discharge status: Home / Self Care | Payer: 59 | Source: Ambulatory Visit | Attending: Physician Assistant | Admitting: Physician Assistant

## 2022-02-09 VITALS — BP 134/92 | HR 91 | Ht 67.0 in | Wt 260.0 lb

## 2022-02-09 DIAGNOSIS — I10 Essential (primary) hypertension: Secondary | ICD-10-CM | POA: Insufficient documentation

## 2022-02-09 DIAGNOSIS — I480 Paroxysmal atrial fibrillation: Secondary | ICD-10-CM | POA: Diagnosis present

## 2022-02-09 DIAGNOSIS — Z6841 Body Mass Index (BMI) 40.0 and over, adult: Secondary | ICD-10-CM | POA: Diagnosis not present

## 2022-02-09 DIAGNOSIS — Z7901 Long term (current) use of anticoagulants: Secondary | ICD-10-CM | POA: Diagnosis not present

## 2022-02-09 DIAGNOSIS — E669 Obesity, unspecified: Secondary | ICD-10-CM | POA: Insufficient documentation

## 2022-02-09 MED ORDER — CARVEDILOL 12.5 MG PO TABS: 18.7500 mg | ORAL_TABLET | Freq: Two times a day (BID) | ORAL | 3 refills | Status: DC

## 2022-02-09 NOTE — Progress Notes (Signed)
Primary Care Physician: April Manson, NP Primary Cardiologist: Dr Royann Shivers  Primary Electrophysiologist: Dr Elberta Fortis Referring Physician: Dr Sarina Ill is a 26 y.o. female with a history of HTN and atrial fibrillation who presents for follow up in the [facility]. The patient was initially diagnosed with atrial fibrillation 09/22/21 after presenting to [facility] with symptoms of rapid heart rate, clamminess, and lightheadedness. ECG showed rapid afib. She was sent to the hospital for DCCV but spontaneously converted prior to the procedure. Patient is on Eliquis for a CHADS2VASC score of 2. In hindsight, she has had intermittent symptoms like this previously. She underwent an afib ablation 01/17/22 with Dr Elberta Fortis.  On follow up today, patient reports that she has done reasonably well since the procedure. She still has episodes of tachypalpitations the resolve quickly. She has noticed her heart rate has been faster. No CP, swallowing pain, or groin issues. No bleeding issues on anticoagulation.   Today, she denies symptoms of chest pain, shortness of breath, orthopnea, PND, lower extremity edema, dizziness, presyncope, syncope, bleeding, or neurologic sequela. The patient is tolerating medications without difficulties and is otherwise without complaint today.    Atrial Fibrillation Risk Factors:  she does not have symptoms or diagnosis of sleep apnea. + upper airway resistance syndrome, did not meet criteria for CPAP. she does not have a history of rheumatic fever.   she has a BMI of Body mass index is 40.72 kg/m.Marland Kitchen Filed Weights   02/09/22 0938  Weight: 117.9 kg    Family History  Problem Relation Age of Onset   Hypertension Mother    Heart disease Father    Cancer Paternal Grandmother        ovarian     Atrial Fibrillation Management history:  Previous antiarrhythmic drugs: none Previous cardioversions: none Previous  ablations: 01/17/22 CHADS2VASC score: 2 Anticoagulation history: Eliquis   Past Medical History:  Diagnosis Date   Anxiety    xanax in past   Headache    Hypertension    Nausea    Weakness    Past Surgical History:  Procedure Laterality Date   ANKLE SURGERY  04/2005   APPENDECTOMY  09/16/11   ATRIAL FIBRILLATION ABLATION N/A 01/17/2022   Procedure: ATRIAL FIBRILLATION ABLATION;  Surgeon: Regan Lemming, MD;  Location: [facility];  Service: Cardiovascular;  Laterality: N/A;   LAPAROSCOPIC APPENDECTOMY  09/16/2011   Procedure: APPENDECTOMY LAPAROSCOPIC;  Surgeon: Velora Heckler, MD;  Location: [facility];  Service: General;  Laterality: N/A;   NASAL SINUS SURGERY  08/2010    Current Outpatient Medications  Medication Sig Dispense Refill   acetaminophen (TYLENOL) 325 MG tablet Take 325 mg by mouth every 6 (six) hours as needed for moderate pain.     amLODipine (NORVASC) 5 MG tablet Take 1 tablet (5 mg total) by mouth daily. 90 tablet 3   apixaban (ELIQUIS) 5 MG TABS tablet Take 1 tablet (5 mg total) by mouth 2 (two) times daily. 60 tablet 11   carvedilol (COREG) 12.5 MG tablet Take 1 tablet (12.5 mg total) by mouth 2 (two) times daily. 60 tablet 3   Multiple Vitamins-Minerals (MULTIVITAMIN WOMEN) TABS Take 1 tablet by mouth daily.     ondansetron (ZOFRAN) 8 MG tablet Take 1 tablet by mouth every 8  hours as needed for Nausea. 20 tablet 1   triamterene-hydrochlorothiazide (DYAZIDE) 37.5-25 MG capsule Take 1 capsule by mouth daily. 90 capsule 3  No current facility-administered medications for this encounter.    No Known Allergies  Social History   Socioeconomic History   Marital status: Single    Spouse name: Not on file   Number of children: Not on file   Years of education: Not on file   Highest education level: Not on file  Occupational History   Not on file  Tobacco Use   Smoking status: Never   Smokeless tobacco: Never   Tobacco comments:    Never smoke  09/28/21  Vaping Use   Vaping Use: Never used  Substance and Sexual Activity   Alcohol use: No   Drug use: No   Sexual activity: Yes    Birth control/protection: None  Other Topics Concern   Not on file  Social History Narrative   Not on file   Social Determinants of Health   Financial Resource Strain: Low Risk  (11/20/2018)   Overall Financial Resource Strain (CARDIA)    Difficulty of Paying Living Expenses: Not hard at all  Food Insecurity: No Food Insecurity (11/20/2018)   Hunger Vital Sign    Worried About Running Out of Food in the Last Year: Never true    Ran Out of Food in the Last Year: Never true  Transportation Needs: Unknown (11/20/2018)   PRAPARE - Administrator, Civil Service (Medical): No    Lack of Transportation (Non-Medical): Not on file  Physical Activity: Not on file  Stress: No Stress Concern Present (11/20/2018)   [employer] of Occupational Health - Occupational Stress Questionnaire    Feeling of Stress : Not at all  Social Connections: Not on file  Intimate Partner Violence: Not At Risk (11/20/2018)   Humiliation, Afraid, Rape, and Kick questionnaire    Fear of Current or Ex-Partner: No    Emotionally Abused: No    Physically Abused: No    Sexually Abused: No     ROS- All systems are reviewed and negative except as per the HPI above.  Physical Exam: Vitals:   02/09/22 0938  BP: (!) 134/92  Pulse: 91  Weight: 117.9 kg  Height: 5\' 7"  (1.702 m)     GEN- The patient is a well appearing obese female, alert and oriented x 3 today.   HEENT-head normocephalic, atraumatic, sclera clear, conjunctiva pink, hearing intact, trachea midline. Lungs- Clear to ausculation bilaterally, normal work of breathing Heart- Regular rate and rhythm, no murmurs, rubs or gallops  GI- soft, NT, ND, + BS Extremities- no clubbing, cyanosis, or edema MS- no significant deformity or atrophy Skin- no rash or lesion Psych- euthymic mood, full  affect Neuro- strength and sensation are intact   Wt Readings from Last 3 Encounters:  02/09/22 117.9 kg  01/17/22 115.7 kg  11/07/21 115.7 kg    EKG today demonstrates  SR Vent. rate 91 BPM PR interval 164 ms QRS duration 90 ms QT/QTcB 370/455 ms  Echo 07/08/20 demonstrated  1. Left ventricular ejection fraction, by estimation, is 55 to 60%. The  left ventricle has normal function. The left ventricle has no regional  wall motion abnormalities. Left ventricular diastolic parameters are  indeterminate.   2. Right ventricular systolic function is normal. The right ventricular  size is normal.   3. The mitral valve is normal in structure. No evidence of mitral valve  regurgitation. No evidence of mitral stenosis.   4. The aortic valve was not well visualized. Aortic valve regurgitation  is not visualized. No aortic stenosis is present.  5. The inferior vena cava is normal in size with greater than 50%  respiratory variability, suggesting right atrial pressure of 3 mmHg.   Epic records are reviewed at length today  CHA2DS2-VASc Score = 2  The patient's score is based upon: CHF History: 0 HTN History: 1 Diabetes History: 0 Stroke History: 0 Vascular Disease History: 0 Age Score: 0 Gender Score: 1       ASSESSMENT AND PLAN: 1. Paroxysmal Atrial Fibrillation (ICD10:  I48.0) The patient's CHA2DS2-VASc score is 2, indicating a 2.2% annual risk of stroke.   S/p afib ablation 01/17/22 She is in SR today but still having some frequent heart racing. Will increase carvedilol to 18.75 mg BID (1.5 tablets BID) Reassured patient that some breakthrough afib is not uncommon soon after ablation. Instructed patient to call clinic if her palpitations become persistent or increase in frequency.  Continue Eliquis 5 mg BID with no missed doses for 3 months post ablation.   2. Obesity Body mass index is 40.72 kg/m. Lifestyle modification was discussed and encouraged including regular  physical activity and weight reduction.  3. HTN Stable, med changes as above.     Follow up with Dr Elberta Fortis as scheduled.    Jorja Loa PA-C Af[facility] [address] [place], [place] [address] [phone] 02/09/2022 9:44 AM

## 2022-02-11 ENCOUNTER — Ambulatory Visit
Admission: EM | Admit: 2022-02-11 | Discharge: 2022-02-11 | Disposition: A | Discharge status: Home / Self Care | Payer: 59 | Attending: Nurse Practitioner | Admitting: Nurse Practitioner

## 2022-02-11 DIAGNOSIS — L03116 Cellulitis of left lower limb: Secondary | ICD-10-CM

## 2022-02-11 MED ORDER — PREDNISONE 20 MG PO TABS: 20.0000 mg | ORAL_TABLET | Freq: Every day | ORAL | 0 refills | Status: AC

## 2022-02-11 MED ORDER — AMOXICILLIN-POT CLAVULANATE 875-125 MG PO TABS: 1.0000 | ORAL_TABLET | Freq: Two times a day (BID) | ORAL | 0 refills | Status: DC

## 2022-02-11 NOTE — ED Provider Notes (Signed)
[facility]    CSN: [id number] Arrival date & time: 02/11/22  1131      History   Chief Complaint Chief Complaint  Patient presents with   Abscess    Inner left thigh - Entered by patient    HPI Eileen Buck is a 26 y.o. female.   The history is provided by the patient.   Patient presents for possible spider bite to the left inner thigh.  Patient states that she was bitten approximately 1 week ago, but over the past 3 days, she has had increased swelling, pain, and redness to the area.  Patient states last evening, she was able to expectorate purulent drainage from the site.  She states that she has noticed that the area increases in redness, then decreases in then back again.  She denies fever, chills, fatigue, chest pain, abdominal pain, nausea, vomiting, or diarrhea.  She has not used any medication for her symptoms.  Past Medical History:  Diagnosis Date   Anxiety    xanax in past   Headache    Hypertension    Nausea    Weakness     Patient Active Problem List   Diagnosis Date Noted   Atrial fibrillation (HCC) 09/22/2021   HTN (hypertension) 11/27/2018   Chronic hypertension affecting pregnancy 11/18/2018   NST (non-stress test) reactive 11/18/2018   Acute appendicitis 09/16/2011    Past Surgical History:  Procedure Laterality Date   ANKLE SURGERY  04/2005   APPENDECTOMY  09/16/11   ATRIAL FIBRILLATION ABLATION N/A 01/17/2022   Procedure: ATRIAL FIBRILLATION ABLATION;  Surgeon: Regan Lemming, MD;  Location: [facility];  Service: Cardiovascular;  Laterality: N/A;   LAPAROSCOPIC APPENDECTOMY  09/16/2011   Procedure: APPENDECTOMY LAPAROSCOPIC;  Surgeon: Velora Heckler, MD;  Location: [facility];  Service: General;  Laterality: N/A;   NASAL SINUS SURGERY  08/2010    OB History     Gravida  1   Para  1   Term  1   Preterm      AB      Living  1      SAB      IAB      Ectopic      Multiple  0   Live Births  1             Home Medications    Prior to Admission medications   Medication Sig Start Date End Date Taking? Authorizing Provider  amoxicillin-clavulanate (AUGMENTIN) 875-125 MG tablet Take 1 tablet by mouth every 12 (twelve) hours. 02/11/22  Yes Annie Saephan-Warren, Sadie Haber, NP  predniSONE (DELTASONE) 20 MG tablet Take 1 tablet (20 mg total) by mouth daily with breakfast for 5 days. 02/11/22 02/16/22 Yes Genevieve Ritzel-Warren, Sadie Haber, NP  acetaminophen (TYLENOL) 325 MG tablet Take 325 mg by mouth every 6 (six) hours as needed for moderate pain.    [provider]  amLODipine (NORVASC) 5 MG tablet Take 1 tablet (5 mg total) by mouth daily. 06/01/21   Croitoru, Mihai, MD  apixaban (ELIQUIS) 5 MG TABS tablet Take 1 tablet (5 mg total) by mouth 2 (two) times daily. 09/22/21   Rollene Rotunda, MD  carvedilol (COREG) 12.5 MG tablet Take 1.5 tablets (18.75 mg total) by mouth 2 (two) times daily. 02/09/22   Fenton, Clint R, PA  Multiple Vitamins-Minerals (MULTIVITAMIN WOMEN) TABS Take 1 tablet by mouth daily.    [provider]  ondansetron (ZOFRAN) 8 MG tablet Take 1 tablet  by mouth every 8  hours as needed for Nausea. 06/22/21     triamterene-hydrochlorothiazide (DYAZIDE) 37.5-25 MG capsule Take 1 capsule by mouth daily. 06/01/21   Croitoru, Mihai, MD    Family History Family History  Problem Relation Age of Onset   Hypertension Mother    Heart disease Father    Cancer Paternal Grandmother        ovarian    Social History Social History   Tobacco Use   Smoking status: Never   Smokeless tobacco: Never   Tobacco comments:    Never smoke 09/28/21  Vaping Use   Vaping Use: Never used  Substance Use Topics   Alcohol use: No   Drug use: No     Allergies   Patient has no known allergies.   Review of Systems Review of Systems Per HPI  Physical Exam Triage Vital Signs ED Triage Vitals  Enc Vitals Group     BP 02/11/22 1236 (!) 137/91     Pulse Rate 02/11/22 1236 96     Resp  02/11/22 1236 18     Temp 02/11/22 1236 98.3 F (36.8 C)     Temp Source 02/11/22 1236 Oral     SpO2 02/11/22 1236 98 %     Weight --      Height --      Head Circumference --      Peak Flow --      Pain Score 02/11/22 1238 8     Pain Loc --      Pain Edu? --      Excl. in GC? --    No data found.  Updated Vital Signs BP (!) 137/91 (BP Location: Right Arm)   Pulse 96   Temp 98.3 F (36.8 C) (Oral)   Resp 18   LMP 01/26/2022 (Exact Date)   SpO2 98%   Visual Acuity Right Eye Distance:   Left Eye Distance:   Bilateral Distance:    Right Eye Near:   Left Eye Near:    Bilateral Near:     Physical Exam Vitals and nursing note reviewed.  Constitutional:      General: She is not in acute distress.    Appearance: Normal appearance.  HENT:     Head: Normocephalic.  Eyes:     Extraocular Movements: Extraocular movements intact.     Conjunctiva/sclera: Conjunctivae normal.     Pupils: Pupils are equal, round, and reactive to light.  Cardiovascular:     Rate and Rhythm: Normal rate and regular rhythm.     Pulses: Normal pulses.     Heart sounds: Normal heart sounds.  Pulmonary:     Effort: Pulmonary effort is normal.     Breath sounds: Normal breath sounds.  Abdominal:     General: Bowel sounds are normal.     Palpations: Abdomen is soft.  Musculoskeletal:     Cervical back: Normal range of motion.  Skin:    General: Skin is warm and dry.     Comments: Large area of erythema to the left inner thigh.  Area measures approximately 14 x 20 cm.  There is a central punctum with dark scabbing present.  There is no oozing, fluctuance, or drainage present.  Area is blanchable and warm to palpation with firmness..  Neurological:     General: No focal deficit present.     Mental Status: She is alert and oriented to person, place, and time.  Psychiatric:  Mood and Affect: Mood normal.        Behavior: Behavior normal.      UC Treatments / Results  Labs (all labs  ordered are listed, but only abnormal results are displayed) Labs Reviewed - No data to display  EKG   Radiology No results found.  Procedures Procedures (including critical care time)  Medications Ordered in UC Medications - No data to display  Initial Impression / Assessment and Plan / UC Course  I have reviewed the triage vital signs and the nursing notes.  Pertinent labs & imaging results that were available during my care of the patient were reviewed by me and considered in my medical decision making (see chart for details).  Patient presents for a spider bite to the left upper thigh.  Symptoms occurred over 1 week ago.  Over the past 3 days, patient has had increased redness, swelling, and warmth to the area.  She was able to drain purulent drainage from the site 1 day ago.  On exam, patient's vital signs are stable, she is in no acute distress.  There is a large erythematous area to the left inner thigh that measures approximately 14 cm x 20 cm.  Area is warm to palpation, and firm.  Symptoms are consistent with cellulitis of the left thigh.  We will start patient on Augmentin and prednisone.  Supportive care recommendations were provided to the patient with strict indications of when to follow-up in this clinic.  Patient verbalizes understanding.  All questions were answered. Final Clinical Impressions(s) / UC Diagnoses   Final diagnoses:  Cellulitis of left thigh     Discharge Instructions      Take medication as prescribed. May take over-the-counter Tylenol or ibuprofen for pain, fever, or general discomfort. For itching, may take Zyrtec during the daytime and Benadryl at bedtime. Cool compresses to the affected area to help with inflammation and itching. Continue to monitor the area for worsening redness, swelling, or drainage.  If you notice the symptoms, please follow-up in this clinic immediately. Follow-up as needed.     ED Prescriptions     Medication Sig  Dispense Auth. Provider   amoxicillin-clavulanate (AUGMENTIN) 875-125 MG tablet Take 1 tablet by mouth every 12 (twelve) hours. 14 tablet Jacobi Nile-Warren, Sadie Haber, NP   predniSONE (DELTASONE) 20 MG tablet Take 1 tablet (20 mg total) by mouth daily with breakfast for 5 days. 5 tablet Tanice Petre-Warren, Sadie Haber, NP      PDMP not reviewed this encounter.   Abran Cantor, NP 02/11/22 1306

## 2022-02-11 NOTE — Discharge Instructions (Signed)
Take medication as prescribed. May take over-the-counter Tylenol or ibuprofen for pain, fever, or general discomfort. For itching, may take Zyrtec during the daytime and Benadryl at bedtime. Cool compresses to the affected area to help with inflammation and itching. Continue to monitor the area for worsening redness, swelling, or drainage.  If you notice the symptoms, please follow-up in this clinic immediately. Follow-up as needed.

## 2022-02-11 NOTE — ED Triage Notes (Signed)
Pt reports having a spider bite n the right upper leg x 3 days. States the are is red, swelling and painful.

## 2022-03-02 ENCOUNTER — Other Ambulatory Visit (HOSPITAL_COMMUNITY): Payer: Self-pay

## 2022-03-10 ENCOUNTER — Other Ambulatory Visit (HOSPITAL_COMMUNITY): Payer: Self-pay

## 2022-03-16 ENCOUNTER — Telehealth: Payer: Self-pay | Admitting: *Deleted

## 2022-03-16 ENCOUNTER — Other Ambulatory Visit (HOSPITAL_COMMUNITY): Payer: Self-pay

## 2022-03-16 MED ORDER — CARVEDILOL 25 MG PO TABS
25.0000 mg | ORAL_TABLET | Freq: Two times a day (BID) | ORAL | 3 refills | Status: DC
  Filled 2022-03-16: qty 180, 90d supply, fill #0
  Filled 2022-04-24 – 2022-06-20 (×3): qty 180, 90d supply, fill #1
  Filled 2022-09-14: qty 180, 90d supply, fill #2
  Filled 2023-01-01: qty 180, 90d supply, fill #3

## 2022-03-16 MED ORDER — AMLODIPINE BESYLATE 2.5 MG PO TABS
2.5000 mg | ORAL_TABLET | Freq: Every day | ORAL | 3 refills | Status: DC
  Filled 2022-03-16: qty 90, 90d supply, fill #0
  Filled 2022-04-24 – 2022-05-09 (×2): qty 90, 90d supply, fill #1

## 2022-03-16 NOTE — Telephone Encounter (Signed)
-----   Message from Sanda Klein, MD sent at 03/16/2022  9:19 AM EDT ----- Please increase carvedilol to 25 mg twice daily and cut amlodipine to 2.5 mg once daily.  Report back to [place] next week with blood pressure and heart rate readings.

## 2022-03-20 ENCOUNTER — Telehealth: Payer: 59 | Admitting: Physician Assistant

## 2022-03-20 ENCOUNTER — Other Ambulatory Visit (HOSPITAL_COMMUNITY): Payer: Self-pay

## 2022-03-20 DIAGNOSIS — S60569A Insect bite (nonvenomous) of unspecified hand, initial encounter: Secondary | ICD-10-CM

## 2022-03-20 DIAGNOSIS — L309 Dermatitis, unspecified: Secondary | ICD-10-CM

## 2022-03-20 DIAGNOSIS — W57XXXA Bitten or stung by nonvenomous insect and other nonvenomous arthropods, initial encounter: Secondary | ICD-10-CM

## 2022-03-20 MED ORDER — PREDNISONE 20 MG PO TABS
40.0000 mg | ORAL_TABLET | Freq: Every day | ORAL | 0 refills | Status: AC
  Filled 2022-03-20: qty 10, 5d supply, fill #0

## 2022-03-20 MED ORDER — TRIAMCINOLONE ACETONIDE 0.1 % EX CREA
1.0000 | TOPICAL_CREAM | Freq: Two times a day (BID) | CUTANEOUS | 0 refills | Status: DC
  Filled 2022-03-20: qty 30, 15d supply, fill #0

## 2022-03-20 NOTE — Progress Notes (Signed)
I have spent 5 minutes in review of e-visit questionnaire, review and updating patient chart, medical decision making and response to patient.   Richards Pherigo Cody Seirra Kos, PA-C    

## 2022-03-20 NOTE — Progress Notes (Signed)
E Visit for Rash  We are sorry that you are not feeling well. Here is how we plan to help!  Based on what you have shared you are having a local reaction to insect bite/sting. Keep up with cool compresses. Ok to continue use of benadryl. I am sending in a 5 day course of prednisone to take as directed to further improve/resolve symptoms.  I have also sent in a script for triamcinolone cream to use as directed, when needed, for the eczema on your hands.   HOME CARE:  Take cool showers and avoid direct sunlight. Apply cool compress or wet dressings. Take a bath in an oatmeal bath.  Sprinkle content of one Aveeno packet under running faucet with comfortably warm water.  Bathe for 15-20 minutes, 1-2 times daily.  Pat dry with a towel. Do not rub the rash. Use hydrocortisone cream. Take an antihistamine like Benadryl for widespread rashes that itch.  The adult dose of Benadryl is 25-50 mg by mouth 4 times daily. Caution:  This type of medication may cause sleepiness.  Do not drink alcohol, drive, or operate dangerous machinery while taking antihistamines.  Do not take these medications if you have prostate enlargement.  Read package instructions thoroughly on all medications that you take.  GET HELP RIGHT AWAY IF:  Symptoms don't go away after treatment. Severe itching that persists. If you rash spreads or swells. If you rash begins to smell. If it blisters and opens or develops a yellow-brown crust. You develop a fever. You have a sore throat. You become short of breath.  MAKE SURE YOU:  Understand these instructions. Will watch your condition. Will get help right away if you are not doing well or get worse.  Thank you for choosing an e-visit.  Your e-visit answers were reviewed by a board certified advanced clinical practitioner to complete your personal care plan. Depending upon the condition, your plan could have included both over the counter or prescription medications.  Please  review your pharmacy choice. Make sure the pharmacy is open so you can pick up prescription now. If there is a problem, you may contact your provider through [employer] and have the prescription routed to another pharmacy.  Your safety is important to [place]. If you have drug allergies check your prescription carefully.   For the next 24 hours you can use MyChart to ask questions about today's visit, request a non-urgent call back, or ask for a work or school excuse. You will get an email in the next two days asking about your experience. I hope that your e-visit has been valuable and will speed your recovery.

## 2022-04-24 ENCOUNTER — Other Ambulatory Visit (HOSPITAL_COMMUNITY): Payer: Self-pay

## 2022-04-24 MED ORDER — ONDANSETRON HCL 8 MG PO TABS
8.0000 mg | ORAL_TABLET | Freq: Three times a day (TID) | ORAL | 1 refills | Status: DC | PRN
  Filled 2022-04-24 – 2022-05-09 (×2): qty 20, 7d supply, fill #0
  Filled 2022-08-08: qty 20, 7d supply, fill #1

## 2022-04-25 ENCOUNTER — Encounter: Payer: Self-pay | Admitting: Cardiology

## 2022-04-25 ENCOUNTER — Ambulatory Visit: Payer: 59 | Attending: Cardiology

## 2022-04-25 ENCOUNTER — Ambulatory Visit: Payer: 59 | Attending: Cardiology | Admitting: Cardiology

## 2022-04-25 VITALS — BP 128/88 | HR 92 | Ht 67.0 in | Wt 262.0 lb

## 2022-04-25 DIAGNOSIS — I10 Essential (primary) hypertension: Secondary | ICD-10-CM

## 2022-04-25 DIAGNOSIS — D6869 Other thrombophilia: Secondary | ICD-10-CM | POA: Diagnosis not present

## 2022-04-25 DIAGNOSIS — I480 Paroxysmal atrial fibrillation: Secondary | ICD-10-CM

## 2022-04-25 DIAGNOSIS — R002 Palpitations: Secondary | ICD-10-CM

## 2022-04-25 NOTE — Patient Instructions (Addendum)
Medication Instructions:  Your physician recommends that you continue on your current medications as directed. Please refer to the Current Medication list given to you today.  *If you need a refill on your cardiac medications before your next appointment, please call your pharmacy*   Lab Work: None ordered   Testing/Procedures:                           ZIO XT- Long Term Monitor Instructions  Your physician has requested you wear a ZIO patch monitor for 14 days.  This is a single patch monitor. [employer] supplies one patch monitor per enrollment. Additional stickers are not available. Please do not apply patch if you will be having a Nuclear Stress Test,  Echocardiogram, Cardiac CT, MRI, or Chest Xray during the period you would be wearing the  monitor. The patch cannot be worn during these tests. You cannot remove and re-apply the  ZIO XT patch monitor.  Your ZIO patch monitor will be mailed 3 day USPS to your address on file. It may take 3-5 days  to receive your monitor after you have been enrolled.  Once you have received your monitor, please review the enclosed instructions. Your monitor  has already been registered assigning a specific monitor serial # to you.  Billing and Patient Assistance Program Information  We have supplied [employer] with any of your insurance information on file for billing purposes. [employer] offers a sliding scale Patient Assistance Program for patients that do not have  insurance, or whose insurance does not completely cover the cost of the ZIO monitor.  You must apply for the Patient Assistance Program to qualify for this discounted rate.  To apply, please call [employer] at [phone], select option 4, select option 2, ask to apply for  Patient Assistance Program. [employer] will ask your household income, and how many people  are in your household. They will quote your out-of-pocket cost based on that information.  [employer] will also be able to set up a  12-month, interest-free payment plan if needed.  Applying the monitor   Shave hair from upper left chest.  Hold abrader disc by orange tab. Rub abrader in 40 strokes over the upper left chest as  indicated in your monitor instructions.  Clean area with 4 enclosed alcohol pads. Let dry.  Apply patch as indicated in monitor instructions. Patch will be placed under collarbone on left  side of chest with arrow pointing upward.  Rub patch adhesive wings for 2 minutes. Remove white label marked "1". Remove the white  label marked "2". Rub patch adhesive wings for 2 additional minutes.  While looking in a mirror, press and release button in center of patch. A small green light will  flash 3-4 times. This will be your only indicator that the monitor has been turned on.  Do not shower for the first 24 hours. You may shower after the first 24 hours.  Press the button if you feel a symptom. You will hear a small click. Record Date, Time and  Symptom in the Patient Logbook.  When you are ready to remove the patch, follow instructions on the last 2 pages of Patient  Logbook. Stick patch monitor onto the last page of Patient Logbook.  Place Patient Logbook in the blue and white box. Use locking tab on box and tape box closed  securely. The blue and white box has prepaid postage on it. Please place it in the   mailbox as  soon as possible. Your physician should have your test results approximately 7 days after the  monitor has been mailed back to [facility].  Call [facility] at [phone] if you have questions regarding  your ZIO XT patch monitor. Call them immediately if you see an orange light blinking on your  monitor.  If your monitor falls off in less than 4 days, contact our Monitor department at [phone].  If your monitor becomes loose or falls off after 4 days call [employer] at [phone] for  suggestions on securing your monitor   Follow-Up: At [facility],  you and your health needs are our priority.  As part of our continuing mission to provide you with exceptional heart care, we have created designated Provider Care Teams.  These Care Teams include your primary Cardiologist (physician) and Advanced Practice Providers (APPs -  Physician Assistants and Nurse Practitioners) who all work together to provide you with the care you need, when you need it.   Your next appointment:   3 month(s)  The format for your next appointment:   In Person  Provider:   Allegra Lai, MD    Thank you for choosing [facility]!!   Trinidad Curet, RN [phone]  Other Instructions    Important Information About Sugar

## 2022-04-25 NOTE — Progress Notes (Signed)
Electrophysiology Office Note   Date:  04/25/2022   ID:  Eileen Buck, DOB 04-05-1996, MRN [id number]  PCP:  Jettie Booze, NP  Cardiologist:  Croitrou Primary Electrophysiologist:  Adie Vilar Meredith Leeds, MD    Chief Complaint: AF   History of Present Illness: Eileen Buck is a 26 y.o. female who is being seen today for the evaluation of AF at the request of White, Delmar Landau, NP. Presenting today for electrophysiology evaluation.  She has a history significant hypertension and atrial fibrillation.  She was diagnosed with atrial fibrillation 09/21/2021 after presenting with palpitations, clamminess, lightheadedness.  ECG showed rapid atrial fibrillation.  She is now status post ablation 01/17/2022.  Today, denies symptoms of palpitations, chest pain, shortness of breath, orthopnea, PND, lower extremity edema, claudication, dizziness, presyncope, syncope, bleeding, or neurologic sequela. The patient is tolerating medications without difficulties.  Since her ablation, she is continue to have episodic palpitations.  They last for a few hours a week.  She feels weak and tired when she has these episodes.  When she is not in 1 of these episodes, she is felt well without complaint.   Past Medical History:  Diagnosis Date   Anxiety    xanax in past   Headache    Hypertension    Nausea    Weakness    Past Surgical History:  Procedure Laterality Date   ANKLE SURGERY  04/2005   APPENDECTOMY  09/16/11   ATRIAL FIBRILLATION ABLATION N/A 01/17/2022   Procedure: ATRIAL FIBRILLATION ABLATION;  Surgeon: Constance Haw, MD;  Location: [place] [facility];  Service: Cardiovascular;  Laterality: N/A;   LAPAROSCOPIC APPENDECTOMY  09/16/2011   Procedure: APPENDECTOMY LAPAROSCOPIC;  Surgeon: Earnstine Regal, MD;  Location: [facility];  Service: General;  Laterality: N/A;   NASAL SINUS SURGERY  08/2010     Current Outpatient Medications  Medication Sig Dispense Refill   amLODipine (NORVASC) 2.5  MG tablet Take 1 tablet (2.5 mg total) by mouth daily. 90 tablet 3   apixaban (ELIQUIS) 5 MG TABS tablet Take 1 tablet (5 mg total) by mouth 2 (two) times daily. 60 tablet 11   carvedilol (COREG) 25 MG tablet Take 1 tablet (25 mg total) by mouth 2 (two) times daily. 180 tablet 3   ondansetron (ZOFRAN) 8 MG tablet Take 1 tablet (8 mg total) by mouth every 8 (eight) hours as needed for nausea. 20 tablet 1   triamterene-hydrochlorothiazide (DYAZIDE) 37.5-25 MG capsule Take 1 capsule by mouth daily. 90 capsule 3   acetaminophen (TYLENOL) 325 MG tablet Take 325 mg by mouth every 6 (six) hours as needed for moderate pain. (Patient not taking: Reported on 04/25/2022)     No current facility-administered medications for this visit.    Allergies:   Patient has no known allergies.   Social History:  The patient  reports that she has never smoked. She has never used smokeless tobacco. She reports that she does not drink alcohol and does not use drugs.   Family History:  The patient's family history includes Cancer in her paternal grandmother; Heart disease in her father; Hypertension in her mother.    ROS:  Please see the history of present illness.   Otherwise, review of systems is positive for none.   All other systems are reviewed and negative.   PHYSICAL EXAM: VS:  BP 128/88   Pulse 92   Ht 5\' 7"  (1.702 m)   Wt 262 lb (118.8 kg)   SpO2  95%   BMI 41.04 kg/m  , BMI Body mass index is 41.04 kg/m. GEN: Well nourished, well developed, in no acute distress  HEENT: normal  Neck: no JVD, carotid bruits, or masses Cardiac: RRR; no murmurs, rubs, or gallops,no edema  Respiratory:  clear to auscultation bilaterally, normal work of breathing GI: soft, nontender, nondistended, + BS MS: no deformity or atrophy  Skin: warm and dry Neuro:  Strength and sensation are intact Psych: euthymic mood, full affect  EKG:  EKG is ordered today. Personal review of the ekg ordered shows sinus rhythm, rate  87  Recent Labs: 09/22/2021: ALT 23; TSH 1.390 01/01/2022: BUN 15; Creatinine, Ser 0.72; Hemoglobin 14.3; Platelets 251; Potassium 3.9; Sodium 139    Lipid Panel  No results found for: "CHOL", "TRIG", "HDL", "CHOLHDL", "VLDL", "LDLCALC", "LDLDIRECT"   Wt Readings from Last 3 Encounters:  04/25/22 262 lb (118.8 kg)  02/09/22 260 lb (117.9 kg)  01/17/22 255 lb (115.7 kg)      Other studies Reviewed: Additional studies/ records that were reviewed today include: TTE 07/08/20  Review of the above records today demonstrates:   1. Left ventricular ejection fraction, by estimation, is 55 to 60%. The  left ventricle has normal function. The left ventricle has no regional  wall motion abnormalities. Left ventricular diastolic parameters are  indeterminate.   2. Right ventricular systolic function is normal. The right ventricular  size is normal.   3. The mitral valve is normal in structure. No evidence of mitral valve  regurgitation. No evidence of mitral stenosis.   4. The aortic valve was not well visualized. Aortic valve regurgitation  is not visualized. No aortic stenosis is present.   5. The inferior vena cava is normal in size with greater than 50%  respiratory variability, suggesting right atrial pressure of 3 mmHg.    ASSESSMENT AND PLAN:  1.  Paroxysmal atrial fibrillation: CHA2DS2-VASc of 2.  Currently on Eliquis 5 mg twice daily, carvedilol 12.5 mg twice daily.  He is status post ablation 01/17/2022.  Fortunately has continued to have episodic palpitations.  Due to that, we Mikaylee Arseneau have her wear a 2-week monitor.  Her Apple Watch has shown that she has had some atrial fibrillation, though she has not got her an ECG from her Apple Watch.  2.  Obesity: Lifestyle modification encouraged Body mass index is 41.04 kg/m.  3.  Hypertension: Currently well controlled   Current medicines are reviewed at length with the patient today.   The patient does not have concerns regarding her  medicines.  The following changes were made today:  none  Labs/ tests ordered today include:  Orders Placed This Encounter  Procedures   LONG TERM MONITOR (3-14 DAYS)   EKG 12-Lead     Disposition:   FU with Camala Talwar 3 months  Signed, Kyvon Hu Meredith Leeds, MD  04/25/2022 10:07 AM     [place] [address] [phone] (office) [phone] (fax)

## 2022-04-25 NOTE — Progress Notes (Unsigned)
Enrolled for [employer] to mail a ZIO XT long term holter monitor to the patients address on file.  

## 2022-04-28 DIAGNOSIS — R002 Palpitations: Secondary | ICD-10-CM

## 2022-04-28 DIAGNOSIS — I480 Paroxysmal atrial fibrillation: Secondary | ICD-10-CM

## 2022-05-09 ENCOUNTER — Other Ambulatory Visit (HOSPITAL_COMMUNITY): Payer: Self-pay

## 2022-05-14 ENCOUNTER — Other Ambulatory Visit: Payer: Self-pay | Admitting: Cardiology

## 2022-05-14 ENCOUNTER — Telehealth: Payer: Self-pay | Admitting: Pharmacist

## 2022-05-14 ENCOUNTER — Other Ambulatory Visit (HOSPITAL_COMMUNITY): Payer: Self-pay

## 2022-05-14 DIAGNOSIS — I480 Paroxysmal atrial fibrillation: Secondary | ICD-10-CM

## 2022-05-14 DIAGNOSIS — I159 Secondary hypertension, unspecified: Secondary | ICD-10-CM

## 2022-05-14 MED ORDER — DILTIAZEM HCL ER 180 MG PO TB24
180.0000 mg | ORAL_TABLET | Freq: Every day | ORAL | 1 refills | Status: DC
  Filled 2022-05-14: qty 90, 90d supply, fill #0

## 2022-05-14 MED ORDER — OLMESARTAN MEDOXOMIL 20 MG PO TABS
20.0000 mg | ORAL_TABLET | Freq: Every day | ORAL | 1 refills | Status: DC
  Filled 2022-05-14: qty 90, 90d supply, fill #0
  Filled 2022-08-08: qty 90, 90d supply, fill #1

## 2022-05-14 NOTE — Telephone Encounter (Signed)
Received message from EP regarding starting diltiazem.  Spoke with patient and checked her BP: 138/92.  Will d/c amlodipine and start olmesartan 20mg  daily and diltiazem 180. Check BMP in 2 weeks.

## 2022-05-15 ENCOUNTER — Other Ambulatory Visit (HOSPITAL_COMMUNITY): Payer: Self-pay

## 2022-05-15 MED ORDER — DILTIAZEM HCL ER COATED BEADS 180 MG PO CP24
180.0000 mg | ORAL_CAPSULE | Freq: Every day | ORAL | 1 refills | Status: DC
  Filled 2022-05-15: qty 90, 90d supply, fill #0
  Filled 2022-08-08: qty 90, 90d supply, fill #1

## 2022-05-15 NOTE — Telephone Encounter (Signed)
Changing to diltiazem CD per insurance

## 2022-06-04 ENCOUNTER — Other Ambulatory Visit (HOSPITAL_BASED_OUTPATIENT_CLINIC_OR_DEPARTMENT_OTHER): Payer: Self-pay

## 2022-06-04 DIAGNOSIS — I159 Secondary hypertension, unspecified: Secondary | ICD-10-CM

## 2022-06-04 DIAGNOSIS — I480 Paroxysmal atrial fibrillation: Secondary | ICD-10-CM

## 2022-06-04 LAB — BASIC METABOLIC PANEL
BUN/Creatinine Ratio: 16 (ref 9–23)
BUN: 15 mg/dL (ref 6–20)
CO2: 23 mmol/L (ref 20–29)
Calcium: 9.6 mg/dL (ref 8.7–10.2)
Chloride: 101 mmol/L (ref 96–106)
Creatinine, Ser: 0.93 mg/dL (ref 0.57–1.00)
Glucose: 95 mg/dL (ref 70–99)
Potassium: 4.4 mmol/L (ref 3.5–5.2)
Sodium: 137 mmol/L (ref 134–144)
eGFR: 86 mL/min/{1.73_m2} (ref >59)

## 2022-06-05 ENCOUNTER — Other Ambulatory Visit (HOSPITAL_COMMUNITY): Payer: Self-pay

## 2022-06-15 ENCOUNTER — Ambulatory Visit: Payer: Self-pay | Admitting: Cardiovascular Disease

## 2022-06-27 ENCOUNTER — Ambulatory Visit: Payer: Self-pay | Admitting: Cardiology

## 2022-08-08 ENCOUNTER — Other Ambulatory Visit: Payer: Self-pay

## 2022-08-08 ENCOUNTER — Other Ambulatory Visit (HOSPITAL_COMMUNITY): Payer: Self-pay

## 2022-08-08 ENCOUNTER — Other Ambulatory Visit: Payer: Self-pay | Admitting: Cardiovascular Disease

## 2022-08-08 MED ORDER — TRIAMTERENE-HCTZ 37.5-25 MG PO CAPS
1.0000 | ORAL_CAPSULE | Freq: Every day | ORAL | 3 refills | Status: DC
  Filled 2022-08-08 – 2022-09-13 (×3): qty 90, 90d supply, fill #0
  Filled 2023-01-01: qty 90, 90d supply, fill #1
  Filled 2023-04-18: qty 90, 90d supply, fill #2
  Filled 2023-07-29: qty 90, 90d supply, fill #3

## 2022-08-10 ENCOUNTER — Ambulatory Visit: Payer: 59 | Admitting: Cardiology

## 2022-08-16 ENCOUNTER — Other Ambulatory Visit (HOSPITAL_COMMUNITY): Payer: Self-pay

## 2022-08-20 ENCOUNTER — Encounter: Payer: Self-pay | Admitting: Cardiology

## 2022-08-20 ENCOUNTER — Ambulatory Visit: Payer: Commercial Managed Care - PPO | Attending: Cardiology | Admitting: Cardiology

## 2022-08-20 ENCOUNTER — Other Ambulatory Visit (HOSPITAL_COMMUNITY): Payer: Self-pay

## 2022-08-20 VITALS — BP 120/84 | HR 93 | Ht 67.0 in | Wt 268.0 lb

## 2022-08-20 DIAGNOSIS — I480 Paroxysmal atrial fibrillation: Secondary | ICD-10-CM

## 2022-08-20 MED ORDER — DILTIAZEM HCL ER COATED BEADS 360 MG PO CP24
360.0000 mg | ORAL_CAPSULE | Freq: Every day | ORAL | 3 refills | Status: DC
  Filled 2022-08-20 – 2022-09-13 (×3): qty 30, 30d supply, fill #0
  Filled 2022-10-17: qty 30, 30d supply, fill #1
  Filled 2022-11-16 (×2): qty 30, 30d supply, fill #2
  Filled 2023-01-01: qty 30, 30d supply, fill #3

## 2022-08-20 NOTE — Progress Notes (Signed)
Electrophysiology Office Note   Date:  08/20/2022   ID:  Eileen Buck 02-12-1996, MRN [id number]  PCP:  Jettie Booze, NP  Cardiologist:  Croitrou Primary Electrophysiologist:  Lahela Woodin Meredith Leeds, MD    Chief Complaint: AF   History of Present Illness: Eileen Buck is a 27 y.o. female who is being seen today for the evaluation of AF at the request of White, Delmar Landau, NP. Presenting today for electrophysiology evaluation.  Significant for hypertension and atrial fibrillation.  She was diagnosed with atrial fibrillation 09/21/2021 after presenting with palpitations, clamminess, lightheadedness.  She is post ablation 01/17/2022.  Today, denies symptoms of palpitations, chest pain, shortness of breath, orthopnea, PND, lower extremity edema, claudication, dizziness, presyncope, syncope, bleeding, or neurologic sequela. The patient is tolerating medications without difficulties.  She has been doing well up until last few days.  Over the weekend, she had chest discomfort.  The chest discomfort occurred in her lower chest on the right side and radiated to the left side.  She states that her heart rate was in the 130s.  This occurred while she was at rest, not related to exertion.    Past Medical History:  Diagnosis Date   Anxiety    xanax in past   Headache    Hypertension    Nausea    Weakness    Past Surgical History:  Procedure Laterality Date   ANKLE SURGERY  04/2005   APPENDECTOMY  09/16/11   ATRIAL FIBRILLATION ABLATION N/A 01/17/2022   Procedure: ATRIAL FIBRILLATION ABLATION;  Surgeon: Constance Haw, MD;  Location: [place] [facility];  Service: Cardiovascular;  Laterality: N/A;   LAPAROSCOPIC APPENDECTOMY  09/16/2011   Procedure: APPENDECTOMY LAPAROSCOPIC;  Surgeon: Earnstine Regal, MD;  Location: [facility];  Service: General;  Laterality: N/A;   NASAL SINUS SURGERY  08/2010     Current Outpatient Medications  Medication Sig Dispense Refill   apixaban  (ELIQUIS) 5 MG TABS tablet Take 1 tablet (5 mg total) by mouth 2 (two) times daily. 60 tablet 11   carvedilol (COREG) 25 MG tablet Take 1 tablet (25 mg total) by mouth 2 (two) times daily. 180 tablet 3   diltiazem (CARDIZEM CD) 360 MG 24 hr capsule Take 1 capsule (360 mg total) by mouth daily. 30 capsule 3   olmesartan (BENICAR) 20 MG tablet Take 1 tablet (20 mg total) by mouth daily. 90 tablet 1   ondansetron (ZOFRAN) 8 MG tablet Take 1 tablet (8 mg total) by mouth every 8 (eight) hours as needed for nausea. 20 tablet 1   triamterene-hydrochlorothiazide (DYAZIDE) 37.5-25 MG capsule Take 1 capsule by mouth daily. 90 capsule 3   acetaminophen (TYLENOL) 325 MG tablet Take 325 mg by mouth every 6 (six) hours as needed for moderate pain.     No current facility-administered medications for this visit.    Allergies:   Patient has no known allergies.   Social History:  The patient  reports that she has never smoked. She has never used smokeless tobacco. She reports that she does not drink alcohol and does not use drugs.   Family History:  The patient's family history includes Cancer in her paternal grandmother; Heart disease in her father; Hypertension in her mother.   ROS:  Please see the history of present illness.   Otherwise, review of systems is positive for none.   All other systems are reviewed and negative.   PHYSICAL EXAM: VS:  BP 120/84  Pulse 93   Ht '5\' 7"'$  (1.702 m)   Wt 268 lb (121.6 kg)   SpO2 98%   BMI 41.97 kg/m  , BMI Body mass index is 41.97 kg/m. GEN: Well nourished, well developed, in no acute distress  HEENT: normal  Neck: no JVD, carotid bruits, or masses Cardiac: RRR; no murmurs, rubs, or gallops,no edema  Respiratory:  clear to auscultation bilaterally, normal work of breathing GI: soft, nontender, nondistended, + BS MS: no deformity or atrophy  Skin: warm and dry Neuro:  Strength and sensation are intact Psych: euthymic mood, full affect  EKG:  EKG is ordered  today. Personal review of the ekg ordered shows sinus rhythm   Recent Labs: 09/22/2021: ALT 23; TSH 1.390 01/01/2022: Hemoglobin 14.3; Platelets 251 06/04/2022: BUN 15; Creatinine, Ser 0.93; Potassium 4.4; Sodium 137    Lipid Panel  No results found for: "CHOL", "TRIG", "HDL", "CHOLHDL", "VLDL", "LDLCALC", "LDLDIRECT"   Wt Readings from Last 3 Encounters:  08/20/22 268 lb (121.6 kg)  04/25/22 262 lb (118.8 kg)  02/09/22 260 lb (117.9 kg)      Other studies Reviewed: Additional studies/ records that were reviewed today include: TTE 07/08/20  Review of the above records today demonstrates:   1. Left ventricular ejection fraction, by estimation, is 55 to 60%. The  left ventricle has normal function. The left ventricle has no regional  wall motion abnormalities. Left ventricular diastolic parameters are  indeterminate.   2. Right ventricular systolic function is normal. The right ventricular  size is normal.   3. The mitral valve is normal in structure. No evidence of mitral valve  regurgitation. No evidence of mitral stenosis.   4. The aortic valve was not well visualized. Aortic valve regurgitation  is not visualized. No aortic stenosis is present.   5. The inferior vena cava is normal in size with greater than 50%  respiratory variability, suggesting right atrial pressure of 3 mmHg.    ASSESSMENT AND PLAN:  1.  Paroxysmal atrial fibrillation: CHA2DS2-VASc of 2.  Currently on Eliquis and carvedilol.  Status post ablation 01/17/2022.  She has not had any further episodes of atrial fibrillation.  She has had some tachycardia which appears due to sinus tachycardia.  She is quite symptomatic from this.  Rosanna Bickle increase diltiazem to 360 mg.  Her chest discomfort is unclear to me.  She did not have any calcium in her coronaries on preablation CT.   2.  Obesity: Lifestyle modification encouraged Body mass index is 41.97 kg/m.  3.  Hypertension: Currently well-controlled   Current  medicines are reviewed at length with the patient today.   The patient does not have concerns regarding her medicines.  The following changes were made today: None  Labs/ tests ordered today include:  Orders Placed This Encounter  Procedures   EKG 12-Lead     Disposition:   FU 3 months  Signed, Evalene Vath Meredith Leeds, MD  08/20/2022 12:39 PM     [place] [address] [place] [address] [phone] (office) [phone] (fax)

## 2022-08-20 NOTE — Patient Instructions (Signed)
Medication Instructions:  Your physician has recommended you make the following change in your medication:  INCREASE Diltiazem to 360 mg once daily  *If you need a refill on your cardiac medications before your next appointment, please call your pharmacy*   Lab Work: None ordered   Testing/Procedures: None ordered   Follow-Up: At [facility], you and your health needs are our priority.  As part of our continuing mission to provide you with exceptional heart care, we have created designated Provider Care Teams.  These Care Teams include your primary Cardiologist (physician) and Advanced Practice Providers (APPs -  Physician Assistants and Nurse Practitioners) who all work together to provide you with the care you need, when you need it.  Your next appointment:   3 month(s)  The format for your next appointment:   In Person  Provider:   Allegra Lai, MD    Thank you for choosing [facility]!!   Trinidad Curet, RN [phone]

## 2022-08-28 ENCOUNTER — Other Ambulatory Visit (HOSPITAL_COMMUNITY): Payer: Self-pay

## 2022-09-08 ENCOUNTER — Other Ambulatory Visit (HOSPITAL_COMMUNITY): Payer: Self-pay

## 2022-09-10 ENCOUNTER — Encounter (HOSPITAL_COMMUNITY): Payer: Self-pay

## 2022-09-10 ENCOUNTER — Other Ambulatory Visit (HOSPITAL_COMMUNITY): Payer: Self-pay

## 2022-09-13 ENCOUNTER — Other Ambulatory Visit (HOSPITAL_COMMUNITY): Payer: Self-pay

## 2022-09-13 ENCOUNTER — Other Ambulatory Visit: Payer: Self-pay

## 2022-09-14 ENCOUNTER — Other Ambulatory Visit (HOSPITAL_COMMUNITY): Payer: Self-pay

## 2022-10-01 DIAGNOSIS — R1084 Generalized abdominal pain: Secondary | ICD-10-CM | POA: Diagnosis not present

## 2022-10-01 DIAGNOSIS — R635 Abnormal weight gain: Secondary | ICD-10-CM | POA: Diagnosis not present

## 2022-10-01 DIAGNOSIS — R110 Nausea: Secondary | ICD-10-CM | POA: Diagnosis not present

## 2022-10-01 DIAGNOSIS — Z1331 Encounter for screening for depression: Secondary | ICD-10-CM | POA: Diagnosis not present

## 2022-10-08 ENCOUNTER — Other Ambulatory Visit: Payer: Self-pay | Admitting: Emergency Medicine

## 2022-10-08 ENCOUNTER — Ambulatory Visit (HOSPITAL_COMMUNITY)
Admission: RE | Admit: 2022-10-08 | Discharge: 2022-10-08 | Disposition: A | Discharge status: Home / Self Care | Payer: Commercial Managed Care - PPO | Source: Ambulatory Visit | Attending: Internal Medicine | Admitting: Internal Medicine

## 2022-10-08 DIAGNOSIS — M79605 Pain in left leg: Secondary | ICD-10-CM

## 2022-10-08 DIAGNOSIS — M7989 Other specified soft tissue disorders: Secondary | ICD-10-CM | POA: Insufficient documentation

## 2022-10-08 NOTE — Progress Notes (Signed)
Pt reports pain and swelling in left leg. Orders received from Dr Royann Shivers

## 2022-10-11 DIAGNOSIS — R110 Nausea: Secondary | ICD-10-CM | POA: Diagnosis not present

## 2022-10-11 DIAGNOSIS — R109 Unspecified abdominal pain: Secondary | ICD-10-CM | POA: Diagnosis not present

## 2022-10-12 ENCOUNTER — Other Ambulatory Visit (HOSPITAL_COMMUNITY): Payer: Self-pay

## 2022-10-12 MED ORDER — ONDANSETRON 8 MG PO TBDP
8.0000 mg | ORAL_TABLET | Freq: Three times a day (TID) | ORAL | 0 refills | Status: DC | PRN
  Filled 2022-10-12: qty 20, 7d supply, fill #0

## 2022-10-12 MED ORDER — CIPROFLOXACIN HCL 500 MG PO TABS
500.0000 mg | ORAL_TABLET | Freq: Two times a day (BID) | ORAL | 0 refills | Status: DC
  Filled 2022-10-12: qty 14, 7d supply, fill #0

## 2022-10-16 ENCOUNTER — Other Ambulatory Visit (HOSPITAL_COMMUNITY): Payer: Self-pay

## 2022-10-16 MED ORDER — ONDANSETRON HCL 8 MG PO TABS
ORAL_TABLET | ORAL | 0 refills | Status: DC
  Filled 2022-10-16: qty 20, 7d supply, fill #0

## 2022-10-17 ENCOUNTER — Other Ambulatory Visit (HOSPITAL_COMMUNITY): Payer: Self-pay

## 2022-10-17 ENCOUNTER — Other Ambulatory Visit: Payer: Self-pay | Admitting: Cardiology

## 2022-10-17 MED ORDER — APIXABAN 5 MG PO TABS
5.0000 mg | ORAL_TABLET | Freq: Two times a day (BID) | ORAL | 9 refills | Status: DC
  Filled 2022-10-17 – 2022-11-16 (×3): qty 60, 30d supply, fill #0
  Filled 2023-01-01: qty 60, 30d supply, fill #1

## 2022-10-17 NOTE — Telephone Encounter (Signed)
Pt last saw Dr Elberta Fortis 08/20/22, last labs 06/04/22 Creat 0.93, age 27, weight 121.6kg, based on specified criteria pt is on appropriate dosage of Eliquis  BID for afib.  Will refill rx.

## 2022-10-25 ENCOUNTER — Other Ambulatory Visit (HOSPITAL_COMMUNITY): Payer: Self-pay

## 2022-11-09 ENCOUNTER — Other Ambulatory Visit: Payer: Self-pay

## 2022-11-09 ENCOUNTER — Other Ambulatory Visit (HOSPITAL_COMMUNITY): Payer: Self-pay

## 2022-11-13 DIAGNOSIS — J029 Acute pharyngitis, unspecified: Secondary | ICD-10-CM | POA: Diagnosis not present

## 2022-11-13 DIAGNOSIS — J301 Allergic rhinitis due to pollen: Secondary | ICD-10-CM | POA: Diagnosis not present

## 2022-11-16 ENCOUNTER — Other Ambulatory Visit: Payer: Self-pay | Admitting: Cardiovascular Disease

## 2022-11-16 ENCOUNTER — Other Ambulatory Visit: Payer: Self-pay

## 2022-11-16 ENCOUNTER — Other Ambulatory Visit (HOSPITAL_COMMUNITY): Payer: Self-pay

## 2022-11-16 DIAGNOSIS — I159 Secondary hypertension, unspecified: Secondary | ICD-10-CM

## 2022-11-16 MED ORDER — OLMESARTAN MEDOXOMIL 20 MG PO TABS
20.0000 mg | ORAL_TABLET | Freq: Every day | ORAL | 1 refills | Status: DC
  Filled 2022-11-16: qty 90, 90d supply, fill #0
  Filled 2023-01-01 – 2023-02-28 (×2): qty 90, 90d supply, fill #1

## 2022-12-12 DIAGNOSIS — N926 Irregular menstruation, unspecified: Secondary | ICD-10-CM | POA: Diagnosis not present

## 2022-12-17 ENCOUNTER — Ambulatory Visit: Payer: Commercial Managed Care - PPO | Attending: Cardiology | Admitting: Cardiology

## 2022-12-17 ENCOUNTER — Encounter: Payer: Self-pay | Admitting: Cardiology

## 2022-12-17 VITALS — BP 120/78 | HR 97 | Ht 67.0 in | Wt 280.0 lb

## 2022-12-17 DIAGNOSIS — I480 Paroxysmal atrial fibrillation: Secondary | ICD-10-CM | POA: Diagnosis not present

## 2022-12-17 DIAGNOSIS — I10 Essential (primary) hypertension: Secondary | ICD-10-CM | POA: Diagnosis not present

## 2022-12-17 NOTE — Patient Instructions (Signed)
Medication Instructions:  Your physician has recommended you make the following change in your medication:  STOP Eliquis  *If you need a refill on your cardiac medications before your next appointment, please call your pharmacy*   Lab Work: None ordered   Testing/Procedures: None ordered   Follow-Up: At [facility], you and your health needs are our priority.  As part of our continuing mission to provide you with exceptional heart care, we have created designated Provider Care Teams.  These Care Teams include your primary Cardiologist (physician) and Advanced Practice Providers (APPs -  Physician Assistants and Nurse Practitioners) who all work together to provide you with the care you need, when you need it.   You have been referred to weight loss clinic   Your next appointment:   6 month(s)  The format for your next appointment:   In Person  Provider:   You will see one of the following Advanced Practice Providers on your designated Care Team:   Francis Dowse, [place] "Mardelle Matte" [place], [place] Roslynn Amble, NP   Thank you for choosing [facility]!!   Dory Horn, RN [phone]

## 2022-12-17 NOTE — Progress Notes (Signed)
  Electrophysiology Office Note:   Date:  12/17/2022  ID:  Eileen Buck, DOB Aug 12, 1995, MRN [id number]  Primary Cardiologist: Thurmon Fair, MD Electrophysiologist: Regan Lemming, MD      History of Present Illness:   Eileen Buck is a 27 y.o. female with h/o atrial fibrillation seen today for routine electrophysiology followup.  Since last being seen in our clinic the patient reports doing okay.  She does note that her heart rate is more rapid, but she has not noted any further episodes of atrial fibrillation.  She does get shortness of breath when she exerts herself, but otherwise has been feeling well.  She does not feel that she has been gaining weight and has not had swelling.  she denies chest pain, palpitations, PND, orthopnea, nausea, vomiting, dizziness, syncope, edema, weight gain, or early satiety.    She has a history of hypertension and atrial fibrillation.  She was diagnosed with atrial fibrillation 09/21/2021 after presenting with palpitations and clamminess.  She is post ablation 01/17/2022.    Review of systems complete and found to be negative unless listed in HPI.   Studies Reviewed:    EKG is ordered today. Personal review as below.  EKG Interpretation  Date/Time:  Monday December 17 2022 11:32:16 EDT Ventricular Rate:  97 PR Interval:  160 QRS Duration: 84 QT Interval:  350 QTC Calculation: 444 R Axis:   77 Text Interpretation: Normal sinus rhythm with sinus arrhythmia Normal ECG When compared with ECG of 09-Feb-2022 09:41, No significant change was found Confirmed by Akiva Brassfield ([id number]) on 12/17/2022 11:37:58 AM     Risk Assessment/Calculations:    CHA2DS2-VASc Score = 2   This indicates a 2.2% annual risk of stroke. The patient's score is based upon: CHF History: 0 HTN History: 1 Diabetes History: 0 Stroke History: 0 Vascular Disease History: 0 Age Score: 0 Gender Score: 1              Physical Exam:   VS:  BP 120/78   Pulse 97   Ht  5\' 7"  (1.702 m)   Wt 280 lb (127 kg)   SpO2 98%   BMI 43.85 kg/m    Wt Readings from Last 3 Encounters:  12/17/22 280 lb (127 kg)  08/20/22 268 lb (121.6 kg)  04/25/22 262 lb (118.8 kg)     GEN: Well nourished, well developed in no acute distress NECK: No JVD; No carotid bruits CARDIAC: Regular rate and rhythm, no murmurs, rubs, gallops RESPIRATORY:  Clear to auscultation without rales, wheezing or rhonchi  ABDOMEN: Soft, non-tender, non-distended EXTREMITIES:  No edema; No deformity   ASSESSMENT AND PLAN:    1.  Paroxysmal atrial fibrillation: CHA2DS2-VASc of 2.  Currently on Eliquis and diltiazem.  Post ablation 01/17/2022.  She remains in sinus rhythm.  Her heart rate is more rapid, but she has not noted any further episodes of atrial fibrillation.  She is somewhat short of breath, but this is likely multifactorial.  Adelai Achey stop Eliquis today.  2.  Obesity: Lifestyle modification encouraged.  She is asked for a referral to [facility] weight loss clinic.  3.  Hypertension: Currently well-controlled  Follow up with EP APP in 6 months  Signed, Kimberlye Dilger Jorja Loa, MD

## 2022-12-24 DIAGNOSIS — K529 Noninfective gastroenteritis and colitis, unspecified: Secondary | ICD-10-CM | POA: Diagnosis not present

## 2022-12-24 DIAGNOSIS — Z79899 Other long term (current) drug therapy: Secondary | ICD-10-CM | POA: Diagnosis not present

## 2022-12-24 DIAGNOSIS — R161 Splenomegaly, not elsewhere classified: Secondary | ICD-10-CM | POA: Diagnosis not present

## 2022-12-24 DIAGNOSIS — Z7901 Long term (current) use of anticoagulants: Secondary | ICD-10-CM | POA: Diagnosis not present

## 2022-12-24 DIAGNOSIS — I10 Essential (primary) hypertension: Secondary | ICD-10-CM | POA: Diagnosis not present

## 2022-12-24 DIAGNOSIS — K760 Fatty (change of) liver, not elsewhere classified: Secondary | ICD-10-CM | POA: Diagnosis not present

## 2022-12-24 DIAGNOSIS — K219 Gastro-esophageal reflux disease without esophagitis: Secondary | ICD-10-CM | POA: Diagnosis not present

## 2022-12-24 DIAGNOSIS — I4891 Unspecified atrial fibrillation: Secondary | ICD-10-CM | POA: Diagnosis not present

## 2022-12-24 DIAGNOSIS — Z9089 Acquired absence of other organs: Secondary | ICD-10-CM | POA: Diagnosis not present

## 2023-01-01 ENCOUNTER — Other Ambulatory Visit: Payer: Self-pay

## 2023-01-01 ENCOUNTER — Other Ambulatory Visit (HOSPITAL_COMMUNITY): Payer: Self-pay

## 2023-01-02 ENCOUNTER — Other Ambulatory Visit (HOSPITAL_COMMUNITY): Payer: Self-pay

## 2023-01-09 ENCOUNTER — Encounter (INDEPENDENT_AMBULATORY_CARE_PROVIDER_SITE_OTHER): Payer: Self-pay | Admitting: Family Medicine

## 2023-02-18 ENCOUNTER — Other Ambulatory Visit: Payer: Self-pay

## 2023-02-28 ENCOUNTER — Other Ambulatory Visit: Payer: Self-pay

## 2023-02-28 ENCOUNTER — Other Ambulatory Visit: Payer: Self-pay | Admitting: Cardiology

## 2023-02-28 ENCOUNTER — Other Ambulatory Visit (HOSPITAL_COMMUNITY): Payer: Self-pay

## 2023-02-28 MED ORDER — DILTIAZEM HCL ER COATED BEADS 360 MG PO CP24
360.0000 mg | ORAL_CAPSULE | Freq: Every day | ORAL | 3 refills | Status: DC
  Filled 2023-02-28: qty 90, 90d supply, fill #0

## 2023-03-08 ENCOUNTER — Telehealth: Payer: Self-pay | Admitting: *Deleted

## 2023-03-08 ENCOUNTER — Encounter (HOSPITAL_COMMUNITY): Payer: Self-pay

## 2023-03-08 ENCOUNTER — Other Ambulatory Visit (HOSPITAL_COMMUNITY): Payer: Self-pay

## 2023-03-08 MED ORDER — DILTIAZEM HCL ER COATED BEADS 180 MG PO CP24
180.0000 mg | ORAL_CAPSULE | Freq: Every day | ORAL | 2 refills | Status: DC
  Filled 2023-03-08: qty 90, 90d supply, fill #0
  Filled 2023-07-12: qty 90, 90d supply, fill #1

## 2023-03-08 NOTE — Telephone Encounter (Signed)
Received a message from pt on 9/12 stating:      Hey I want to try to go back to the smaller dose of the Diltiazem or come off of it completely ..I have been taking it here and there and noticed when I do take the 360 mg I feel BLAH! what do you suggest I do? I have not taken it the last week and a half and have been okay.   Discussed w/ MD. Pt aware ok to decrease to 180 mg daily. Rx sent to [facility] pharmacy per pt request.

## 2023-03-20 IMAGING — DX DG CHEST 2V
2 series · 2 of 2 positions shown · non-contrast
Comparison: November 01, 2020

CLINICAL DATA: Chest pain and shortness of breath.

EXAM:
CHEST - 2 VIEW

[chest pa]
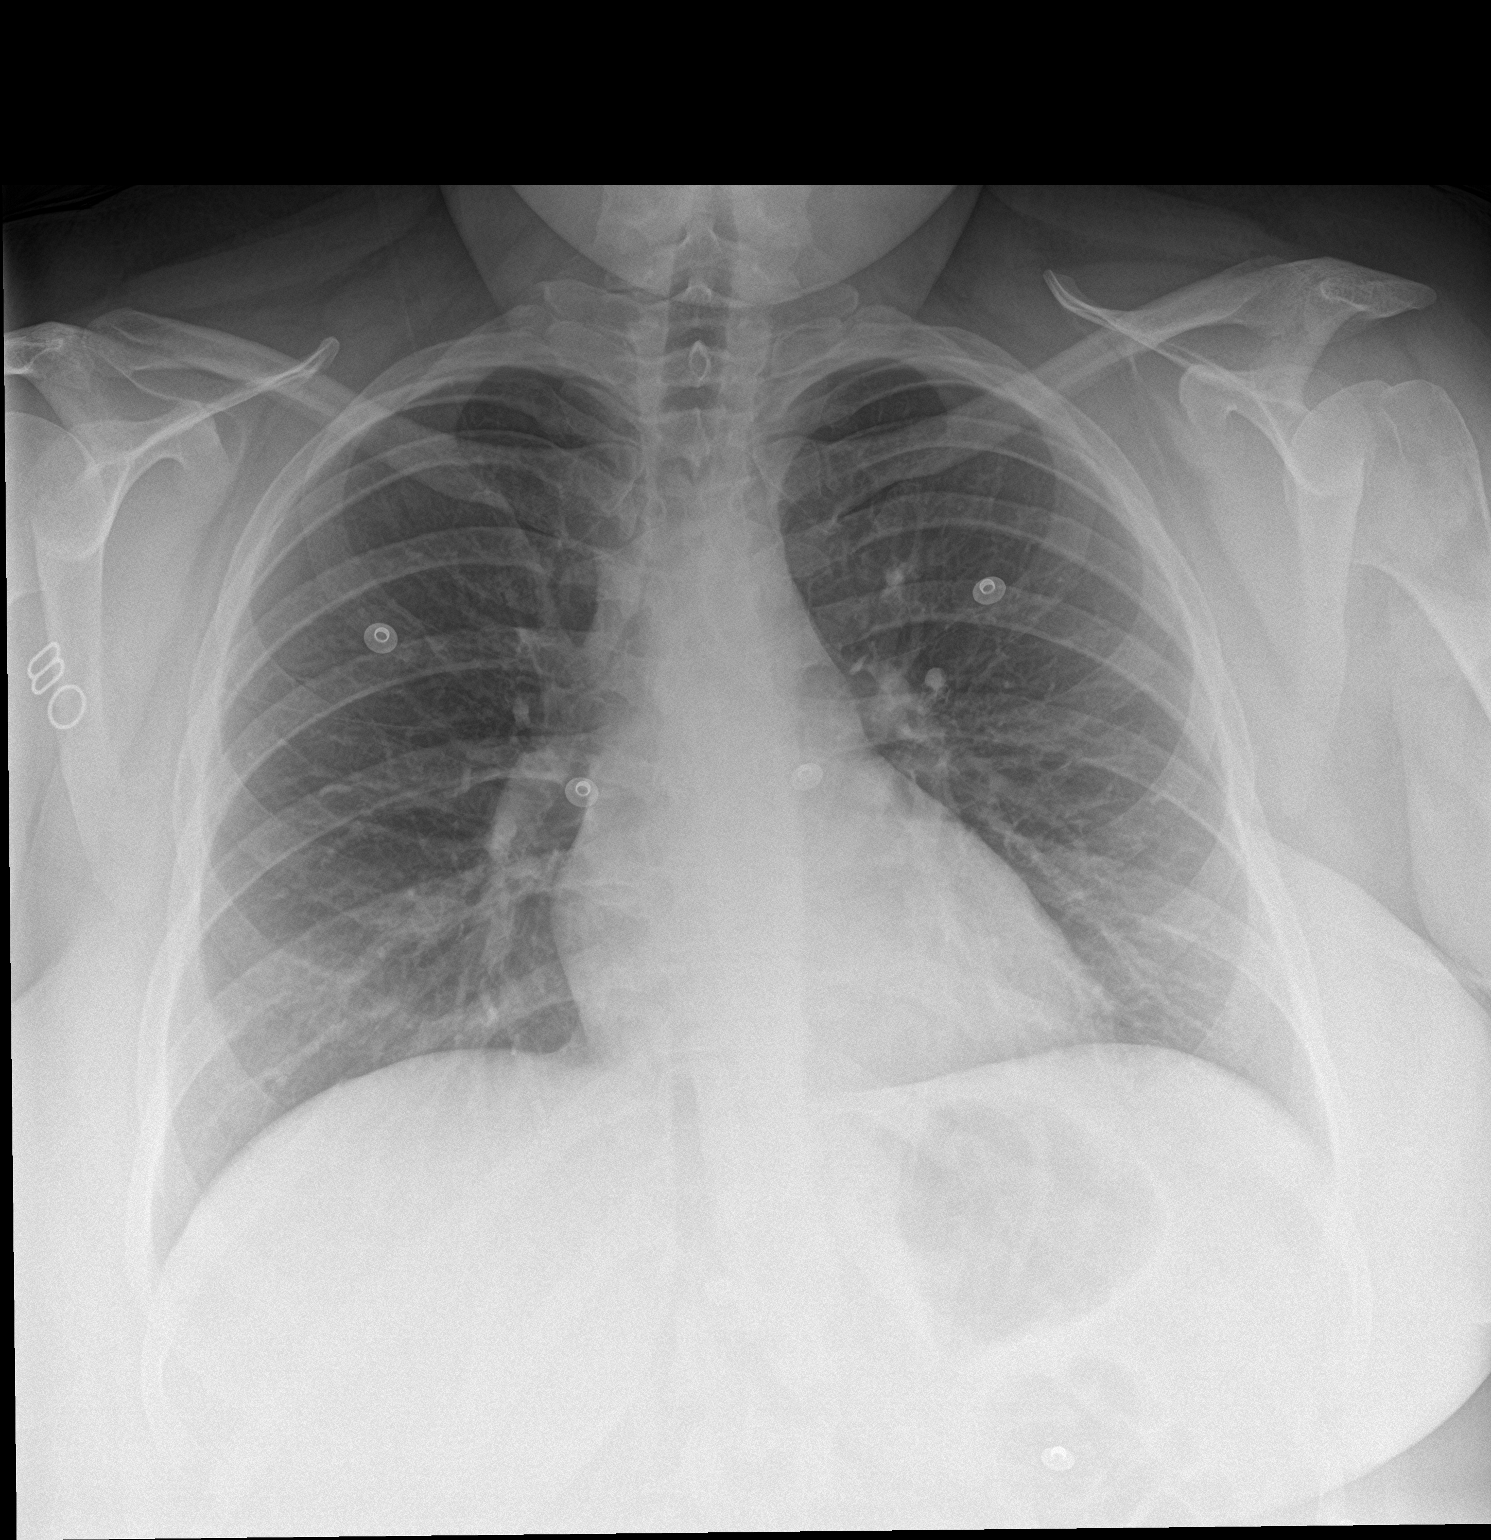

[chest lat]
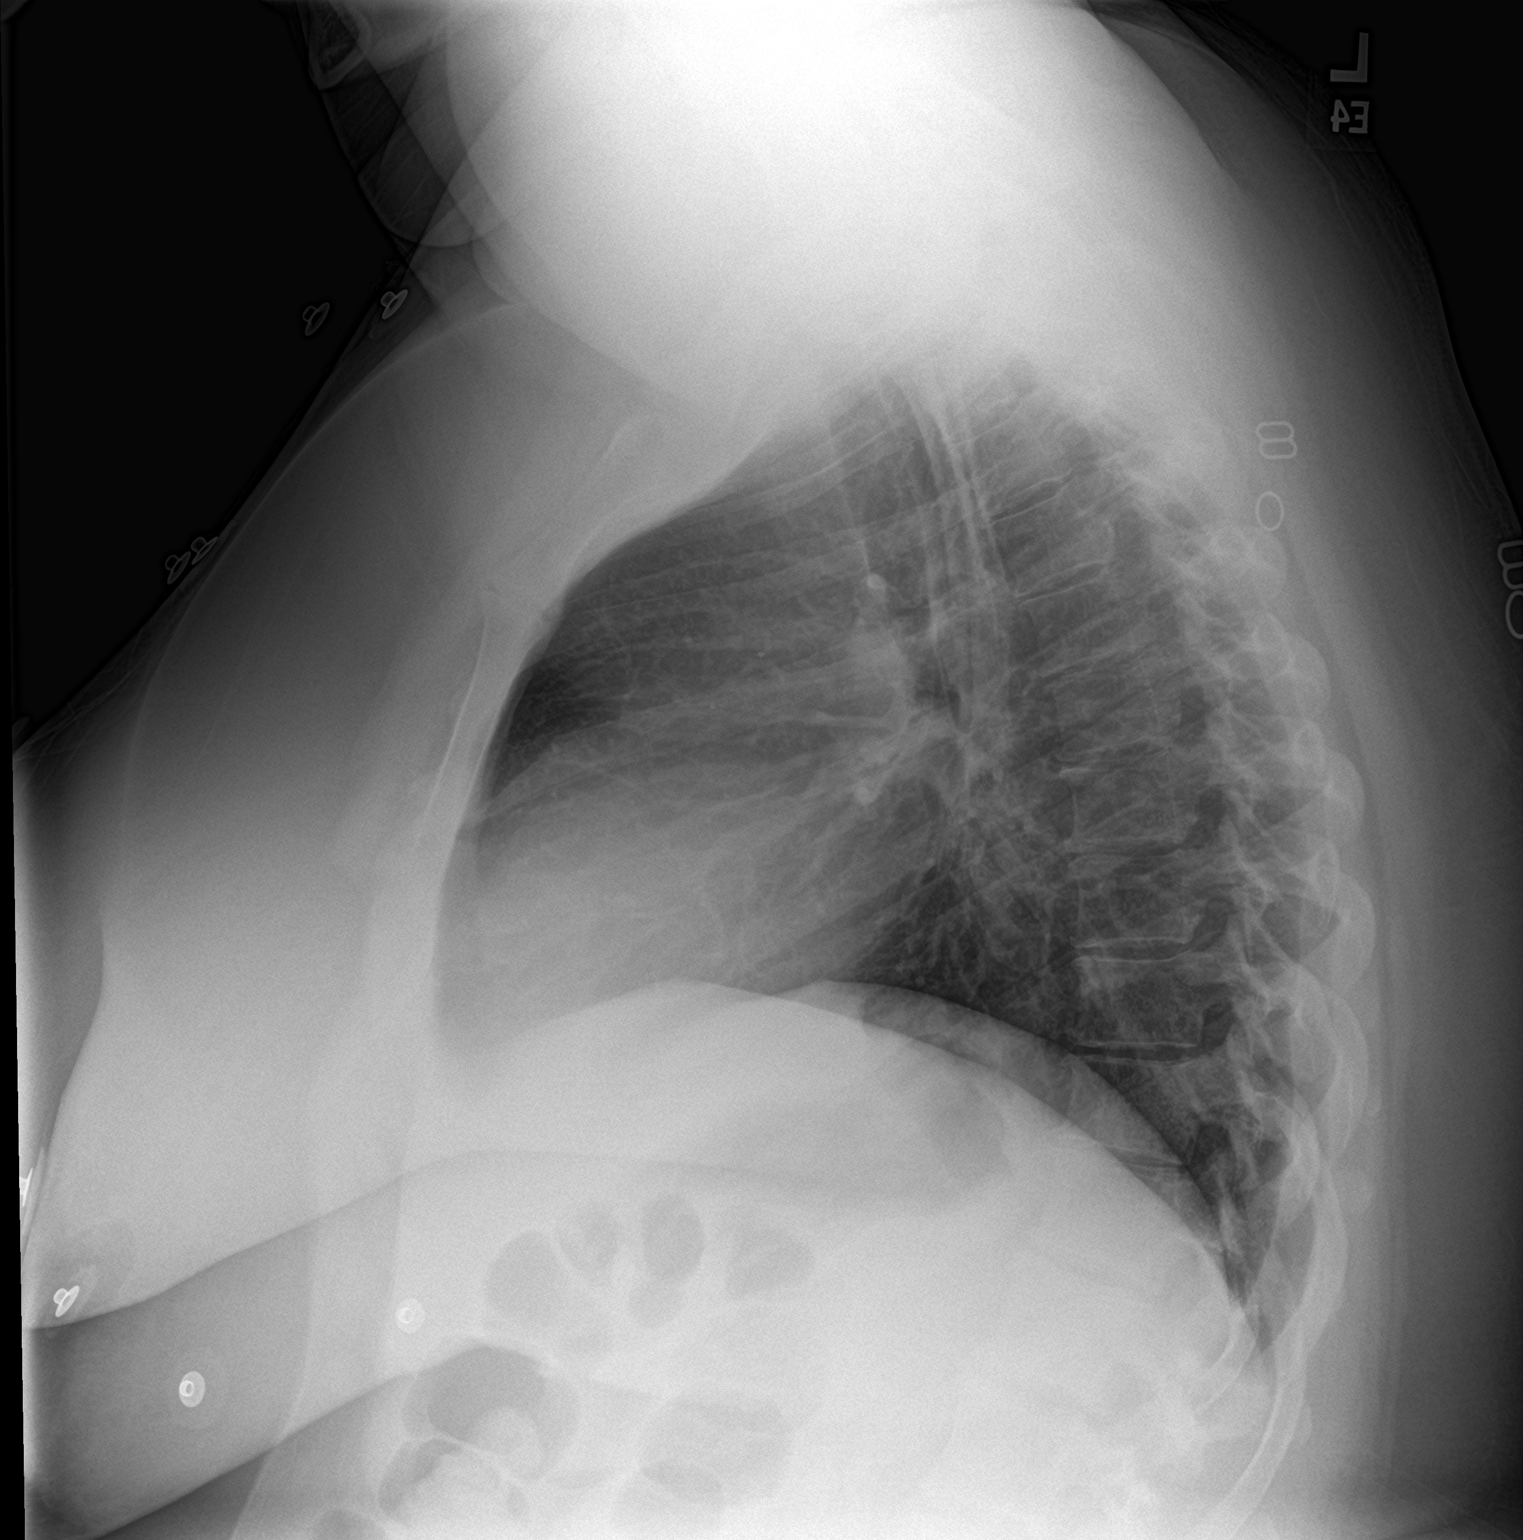

[2 of 2 positions shown; findings below may reference images not displayed]

FINDINGS: The heart size and mediastinal contours are within normal limits.
Both lungs are clear. The visualized skeletal structures are
unremarkable.
IMPRESSION: No active cardiopulmonary disease.

## 2023-04-18 ENCOUNTER — Other Ambulatory Visit (HOSPITAL_COMMUNITY): Payer: Self-pay

## 2023-04-18 ENCOUNTER — Other Ambulatory Visit: Payer: Self-pay | Admitting: Cardiovascular Disease

## 2023-04-19 ENCOUNTER — Other Ambulatory Visit (HOSPITAL_COMMUNITY): Payer: Self-pay

## 2023-04-19 ENCOUNTER — Other Ambulatory Visit: Payer: Self-pay

## 2023-04-19 MED ORDER — CARVEDILOL 25 MG PO TABS
25.0000 mg | ORAL_TABLET | Freq: Two times a day (BID) | ORAL | 2 refills | Status: DC
  Filled 2023-04-19: qty 180, 90d supply, fill #0

## 2023-04-23 ENCOUNTER — Ambulatory Visit (HOSPITAL_COMMUNITY): Payer: Commercial Managed Care - PPO | Admitting: Physician Assistant

## 2023-05-14 ENCOUNTER — Other Ambulatory Visit (HOSPITAL_COMMUNITY): Payer: Self-pay

## 2023-05-15 ENCOUNTER — Ambulatory Visit: Payer: Commercial Managed Care - PPO | Attending: Cardiovascular Disease | Admitting: Cardiovascular Disease

## 2023-05-15 ENCOUNTER — Other Ambulatory Visit (HOSPITAL_COMMUNITY): Payer: Self-pay

## 2023-05-15 ENCOUNTER — Encounter: Payer: Self-pay | Admitting: Cardiovascular Disease

## 2023-05-15 ENCOUNTER — Ambulatory Visit: Payer: Commercial Managed Care - PPO

## 2023-05-15 VITALS — BP 118/91 | HR 121 | Ht 67.0 in | Wt 283.0 lb

## 2023-05-15 DIAGNOSIS — I4891 Unspecified atrial fibrillation: Secondary | ICD-10-CM | POA: Diagnosis not present

## 2023-05-15 MED ORDER — CARVEDILOL 25 MG PO TABS
37.5000 mg | ORAL_TABLET | Freq: Two times a day (BID) | ORAL | 3 refills | Status: DC
  Filled 2023-05-15 – 2023-07-12 (×3): qty 270, 90d supply, fill #0

## 2023-05-15 NOTE — Patient Instructions (Signed)
Medication Instructions:  Increase Carvedilol to 37.5 mg twice a day= 1.5 tablets twice daily *If you need a refill on your cardiac medications before your next appointment, please call your pharmacy*   Lab Work: CMP, TSH, Catecholamines, Metanephrines- today If you have labs (blood work) drawn today and your tests are completely normal, you will receive your results only by: MyChart Message (if you have MyChart) OR A paper copy in the mail If you have any lab test that is abnormal or we need to change your treatment, we will call you to review the results.   Testing/Procedures: Your physician has recommended that you wear a 3 DAY ZIO-PATCH monitor. The Zio patch cardiac monitor continuously records heart rhythm data for up to 14 days, this is for patients being evaluated for multiple types heart rhythms. For the first 24 hours post application, please avoid getting the Zio monitor wet in the shower or by excessive sweating during exercise. After that, feel free to carry on with regular activities. Keep soaps and lotions away from the ZIO XT Patch.  This will be mailed to you, please expect 7-10 days to receive.    Applying the monitor   Shave hair from upper left chest.   Hold abrader disc by orange tab.  Rub abrader in 40 strokes over left upper chest as indicated in your monitor instructions.   Clean area with 4 enclosed alcohol pads .  Use all pads to assure are is cleaned thoroughly.  Let dry.   Apply patch as indicated in monitor instructions.  Patch will be place under collarbone on left side of chest with arrow pointing upward.   Rub patch adhesive wings for 2 minutes.Remove white label marked "1".  Remove white label marked "2".  Rub patch adhesive wings for 2 additional minutes.   While looking in a mirror, press and release button in center of patch.  A small green light will flash 3-4 times .  This will be your only indicator the monitor has been turned on.     Do not  shower for the first 24 hours.  You may shower after the first 24 hours.   Press button if you feel a symptom. You will hear a small click.  Record Date, Time and Symptom in the Patient Log Book.   When you are ready to remove patch, follow instructions on last 2 pages of Patient Log Book.  Stick patch monitor onto last page of Patient Log Book.   Place Patient Log Book in [place] box.  Use locking tab on box and tape box closed securely.  The [employer] and [employer] has [employer] on it.  Please place in mailbox as soon as possible.  Your physician should have your test results approximately 7 days after the monitor has been mailed back to [facility].   Call [facility] Customer Care at [phone] if you have questions regarding your ZIO XT patch monitor.  Call them immediately if you see an orange light blinking on your monitor.   If your monitor falls off in less than 4 days contact our Monitor department at [phone].  If your monitor becomes loose or falls off after 4 days call [employer] at [phone] for suggestions on securing your monitor    Follow-Up: At [facility], you and your health needs are our priority.  As part of our continuing mission to provide you with exceptional heart care, we have created designated Provider Care Teams.  These Care Teams  include your primary Cardiologist (physician) and Advanced Practice Providers (APPs -  Physician Assistants and Nurse Practitioners) who all work together to provide you with the care you need, when you need it.  We recommend signing up for the patient portal called "MyChart".  Sign up information is provided on this After Visit Summary.  MyChart is used to connect with patients for Virtual Visits (Telemedicine).  Patients are able to view lab/test results, encounter notes, upcoming appointments, etc.  Non-urgent messages can be sent to your provider as well.   To learn more about what you can do with MyChart, go to  [email or web address].    Your next appointment:   Follow up as needed  Provider:   Thurmon Fair, MD

## 2023-05-15 NOTE — Progress Notes (Signed)
Cardiology Office Note:  .   Date:  05/15/2023  ID:  Eileen Buck, DOB Jun 01, 1996, MRN [id number] PCP: April Manson, NP  [facility] Providers Cardiologist:  Thurmon Fair, MD Electrophysiologist:  Will Jorja Loa, MD    History of Present Illness: Eileen Buck is a 27 y.o. female with surprisingly early onset of paroxysmal atrial fibrillation s/p radiofrequency ablation about a year ago, hypertension without identifiable secondary cause, morbid obesity alcohol today with complaints of shortness of breath and elevated blood pressure.  She started feeling unwell, rather flushed on Sunday.  The next day on Monday she began feeling short of breath.  Her blood pressure, which has otherwise been well-controlled was elevated at 170/120s.  She has been carefully compliant with all her medications including carvedilol and diltiazem.  Recently her smart watch has showed an increased burden of "atrial fibrillation" (usually reports around 1%, now up to 14%).  She is tachycardic right now, but the rhythm is regular and EKG shows convincing evidence of sinus tachycardia.  After her visit with Dr. Elberta Fortis in July this year her Eliquis was discontinued.  ROS: Denies lower extremity edema, orthopnea, PND, chest pain, syncope or focal neurological complaints.  Studies Reviewed: Marland Kitchen   EKG Interpretation Date/Time:  Wednesday May 15 2023 15:50:13 EST Ventricular Rate:  121 PR Interval:  162 QRS Duration:  86 QT Interval:  324 QTC Calculation: 460 R Axis:   59  Text Interpretation: Sinus tachycardia When compared with ECG of 17-Dec-2022 11:32, No significant change was found Confirmed by Aj Crunkleton [phone]) on 05/15/2023 3:52:00 PM     Risk Assessment/Calculations:    CHA2DS2-VASc Score = 2   This indicates a 2.2% annual risk of stroke. The patient's score is based upon: CHF History: 0 HTN History: 1 Diabetes History: 0 Stroke History: 0 Vascular Disease  History: 0 Age Score: 0 Gender Score: 1     HYPERTENSION CONTROL Vitals:   05/15/23 1546 05/15/23 1853  BP: 112/82 (!) 118/91    The patient's blood pressure is elevated above target today.  In order to address the patient's elevated BP: A current anti-hypertensive medication was adjusted today.          Physical Exam:   VS:  BP (!) 118/91   Pulse (!) 121   Ht 5\' 7"  (1.702 m)   Wt 283 lb (128.4 kg)   SpO2 99%   BMI 44.32 kg/m    Wt Readings from Last 3 Encounters:  05/15/23 283 lb (128.4 kg)  12/17/22 280 lb (127 kg)  08/20/22 268 lb (121.6 kg)    GEN: Well nourished, well developed in no acute distress.  Looks a little flushed NECK: No JVD; No carotid bruits CARDIAC: Tachycardia, RRR, no murmurs, rubs, gallops RESPIRATORY:  Clear to auscultation without rales, wheezing or rhonchi  ABDOMEN: Soft, non-tender, non-distended EXTREMITIES:  No edema; No deformity   ASSESSMENT AND PLAN: .    Sinus tachycardia: She reports very careful compliance with her carvedilol.  Her blood pressure is also elevated.  It has always been surprising that her blood pressures so hard to treat and that she has had arrhythmia such a young age.  Obviously her obesity could have something to do with this, but I think we need to screen more carefully for pheochromocytoma.  Today we will send off serum catecholamines and metanephrines.  May need to do a 24-hour urine collection.  Obviously the results may be skewed by her chronic  treatment with carvedilol.  Will also increase her carvedilol today to 37.5 mg twice daily.  Continue diltiazem olmesartan and triamterene-hydrochlorothiazide.  Recheck TSH and been active. AFib: Successful ablation in July 2023, no documented recurrence of arrhythmia since then.  Off anticoagulants since July 2024.  Her smart watch has reported "atrial fibrillation" , but there are no tracings to review.  We will try to confirm this with atrial monitor. HTN: Diastolic blood  pressure is borderline high.  Increased carvedilol. Obesity: As previously discussed, weight loss would be highly beneficial, but is obviously frustrating difficult to achieve.       Dispo: Increase carvedilol to 37.5 mg twice daily, 3-day arrhythmia monitor, check screening labs for pheochromocytoma and check CMET/TSH as ordered.    Signed, Thurmon Fair, MD

## 2023-05-15 NOTE — Progress Notes (Unsigned)
Enrolled patient for a 3 day Zio XT monitor to be mailed to patients home  

## 2023-05-16 ENCOUNTER — Telehealth: Payer: Self-pay | Admitting: Emergency Medicine

## 2023-05-16 NOTE — Telephone Encounter (Signed)
Pt no longer anticoagulated. Can have tylenol or ibuprofen for HA

## 2023-05-16 NOTE — Telephone Encounter (Signed)
Pt asking what is ok for her to take for headaches

## 2023-05-16 NOTE — Telephone Encounter (Signed)
Agree with recommendations.  

## 2023-05-16 NOTE — Telephone Encounter (Signed)
11:22- Pt- last night at 10pm BP was 159/94, this morning at 11am its 151/100, still not feeling very good at all. Any suggestions? I know its just a waiting game to [facility]   Dr Dimas Chyle is after taking the carvedilol 37.5 mg this morning?  Pt- Yes, sir, I started the new dose last night  Dr C- Go ahead and take an extra olmesartan 20 mg as well.  We can send in a prescription for 40 mg tablets if necessary.  That med takes a little longer to kick in, I would wait several hours before checking again.  1125- Pt- okay will do, sorry for bothering you but I am just kinda worried especially going on a few days now  Dr C- never hesitate to call - always want to help  1126- Pt- thank you I appreciate it my chest still feels heavy, and I am SOB...at what point would I need to go to ER?  Katie, RN- Any other symptoms? sweating, chest/arm/jaw/back pain, nausea, dizziness?  Are you short of breath at rest or with activity? Are you feeling faint/light-headed?  1134- Pt- nauseous, SOB at rest, light headed a little bit. I have been trying to take it easy the best I can  Katie, RN- ok; and eating and drinking? Are you well hydrated and have you eaten today? how is your heart rate?  1140- Pt- I have no had much of an appetite, I have been drinking water, I did eat a piece of toast to take with meds with morning. HR 115

## 2023-05-16 NOTE — Telephone Encounter (Signed)
Gave this information to the patient. She verbalized understanding

## 2023-05-16 NOTE — Telephone Encounter (Signed)
Pt reports still feeling a little short of breath and head hurts a bit. BP 118/74 hr 98. Asked the patient to call me.  Pt called and I could tell that she is a little short of breath over the phone. She reports that she is just sitting on the couch with her laptop, she has not been active- but she is short of breath.  Instructed the patient to go to the ER for evaluation if her SOB increases and/or if chest heaviness increases and/or if she has any of the following: sweating, nausea, chest/arm/back/jaw pain, dizziness.  She verbalized understanding. Informed her that I would give all of this information to Dr Royann Shivers as well.

## 2023-05-17 ENCOUNTER — Emergency Department (HOSPITAL_COMMUNITY): Payer: Commercial Managed Care - PPO

## 2023-05-17 ENCOUNTER — Other Ambulatory Visit: Payer: Self-pay

## 2023-05-17 ENCOUNTER — Encounter (HOSPITAL_COMMUNITY): Payer: Self-pay

## 2023-05-17 ENCOUNTER — Emergency Department (HOSPITAL_COMMUNITY)
Admission: EM | Admit: 2023-05-17 | Discharge: 2023-05-17 | Disposition: A | Discharge status: Home / Self Care | Payer: Commercial Managed Care - PPO | Attending: Student | Admitting: Student

## 2023-05-17 ENCOUNTER — Other Ambulatory Visit (HOSPITAL_COMMUNITY): Payer: Self-pay

## 2023-05-17 DIAGNOSIS — J189 Pneumonia, unspecified organism: Secondary | ICD-10-CM

## 2023-05-17 DIAGNOSIS — R0602 Shortness of breath: Secondary | ICD-10-CM | POA: Diagnosis not present

## 2023-05-17 DIAGNOSIS — I10 Essential (primary) hypertension: Secondary | ICD-10-CM | POA: Insufficient documentation

## 2023-05-17 DIAGNOSIS — R079 Chest pain, unspecified: Secondary | ICD-10-CM | POA: Diagnosis not present

## 2023-05-17 DIAGNOSIS — R0789 Other chest pain: Secondary | ICD-10-CM | POA: Diagnosis not present

## 2023-05-17 DIAGNOSIS — J168 Pneumonia due to other specified infectious organisms: Secondary | ICD-10-CM | POA: Insufficient documentation

## 2023-05-17 DIAGNOSIS — R918 Other nonspecific abnormal finding of lung field: Secondary | ICD-10-CM | POA: Diagnosis not present

## 2023-05-17 DIAGNOSIS — R0989 Other specified symptoms and signs involving the circulatory and respiratory systems: Secondary | ICD-10-CM | POA: Diagnosis not present

## 2023-05-17 DIAGNOSIS — Z79899 Other long term (current) drug therapy: Secondary | ICD-10-CM | POA: Insufficient documentation

## 2023-05-17 HISTORY — DX: Unspecified atrial fibrillation: I48.91

## 2023-05-17 LAB — CBC
HCT: 43.6 % (ref 36.0–46.0)
Hemoglobin: 14 g/dL (ref 12.0–15.0)
MCH: 28.3 pg (ref 26.0–34.0)
MCHC: 32.1 g/dL (ref 30.0–36.0)
MCV: 88.3 fL (ref 80.0–100.0)
Platelets: 232 10*3/uL (ref 150–400)
RBC: 4.94 MIL/uL (ref 3.87–5.11)
RDW: 13.9 % (ref 11.5–15.5)
WBC: 10 10*3/uL (ref 4.0–10.5)
nRBC: 0 % (ref 0.0–0.2)

## 2023-05-17 LAB — BASIC METABOLIC PANEL
Anion gap: 9 (ref 5–15)
BUN: 12 mg/dL (ref 6–20)
CO2: 23 mmol/L (ref 22–32)
Calcium: 8.9 mg/dL (ref 8.9–10.3)
Chloride: 104 mmol/L (ref 98–111)
Creatinine, Ser: 0.85 mg/dL (ref 0.44–1.00)
GFR, Estimated: >60 mL/min (ref >60)
Glucose, Bld: 92 mg/dL (ref 70–99)
Potassium: 4 mmol/L (ref 3.5–5.1)
Sodium: 136 mmol/L (ref 135–145)

## 2023-05-17 LAB — HCG, SERUM, QUALITATIVE: Preg, Serum: NEGATIVE

## 2023-05-17 LAB — TROPONIN I (HIGH SENSITIVITY): Troponin I (High Sensitivity): 3 ng/L (ref <18)

## 2023-05-17 MED ORDER — AMOXICILLIN-POT CLAVULANATE 875-125 MG PO TABS
1.0000 | ORAL_TABLET | Freq: Once | ORAL | Status: AC
  Administered 2023-05-17: 1 via ORAL
  Filled 2023-05-17: qty 1

## 2023-05-17 MED ORDER — AMOXICILLIN-POT CLAVULANATE 875-125 MG PO TABS
1.0000 | ORAL_TABLET | Freq: Two times a day (BID) | ORAL | 0 refills | Status: DC
  Filled 2023-05-17: qty 14, 7d supply, fill #0

## 2023-05-17 MED ORDER — ACETAMINOPHEN 500 MG PO TABS
1000.0000 mg | ORAL_TABLET | Freq: Once | ORAL | Status: AC
  Administered 2023-05-17: 1000 mg via ORAL
  Filled 2023-05-17: qty 2

## 2023-05-17 NOTE — ED Notes (Signed)
CCMD called and notified about Pt.

## 2023-05-17 NOTE — ED Triage Notes (Addendum)
Pt to ED via pov from home. Pt has had SHOB, generally not feeling well and fatigue for past 5 days.  Pt seen at cardiologist for same 2 days ago and was told to come to ED if [facility] worsened.  Pt states she has left sided intermittent chest pain that started at approximately 4am today and is feeling more SHOB. Pt states her daughter was diagnosed with pneumonia last week.

## 2023-05-17 NOTE — ED Provider Notes (Signed)
[place] [facility] DEPARTMENT AT [facility] Provider Note  CSN: [id number] Arrival date & time: 05/17/23 0930  Chief Complaint(s) Chest Pain  HPI Eileen Buck is a 27 y.o. female with PMH atrial fibrillation status post placental diltiazem not currently anticoagulated, HTN who presents emergency room for evaluation of chest pain and malaise.  States that her daughter was recently diagnosed with mycoplasma pneumonia.  Has been feeling shortness of breath, generalized malaise and fatigue for the last 5 days.  Felt that shortness of breath is worse in the last 24 hours with associated intermittent pleuritic chest pain.  Denies abdominal pain, nausea, vomiting, headache, fever or other systemic symptoms.   Past Medical History Past Medical History:  Diagnosis Date   Anxiety    xanax in past, pt states this is no longer a problem   Atrial fibrillation (HCC)    Headache    Hypertension    Nausea    Weakness    Patient Active Problem List   Diagnosis Date Noted   Atrial fibrillation (HCC) 09/22/2021   HTN (hypertension) 11/27/2018   Chronic hypertension affecting pregnancy 11/18/2018   NST (non-stress test) reactive 11/18/2018   Acute appendicitis 09/16/2011   Home Medication(s) Prior to Admission medications   Medication Sig Start Date End Date Taking? Authorizing Provider  amoxicillin-clavulanate (AUGMENTIN) 875-125 MG tablet Take 1 tablet by mouth every 12 (twelve) hours for 7 days 05/17/23  Yes Malesha Suliman, MD  carvedilol (COREG) 25 MG tablet Take 1.5 tablets (37.5 mg total) by mouth 2 (two) times daily. 05/15/23  Yes Croitoru, Mihai, MD  diltiazem (CARDIZEM CD) 180 MG 24 hr capsule Take 1 capsule (180 mg total) by mouth daily. 03/08/23  Yes Camnitz, Will Daphine Deutscher, MD  olmesartan (BENICAR) 20 MG tablet Take 1 tablet (20 mg) by mouth daily. 11/16/22  Yes Croitoru, Mihai, MD  promethazine (PHENERGAN) 12.5 MG tablet Take 12.5 mg by mouth every 6 (six) hours as needed  for nausea or vomiting.   Yes [provider]  triamterene-hydrochlorothiazide (DYAZIDE) 37.5-25 MG capsule Take 1 capsule by mouth daily. 08/08/22  Yes Croitoru, Rachelle Hora, MD                                                                                                                                    Past Surgical History Past Surgical History:  Procedure Laterality Date   ANKLE SURGERY  04/2005   APPENDECTOMY  09/16/11   ATRIAL FIBRILLATION ABLATION N/A 01/17/2022   Procedure: ATRIAL FIBRILLATION ABLATION;  Surgeon: Regan Lemming, MD;  Location: [facility] INVASIVE CV LAB;  Service: Cardiovascular;  Laterality: N/A;   LAPAROSCOPIC APPENDECTOMY  09/16/2011   Procedure: APPENDECTOMY LAPAROSCOPIC;  Surgeon: Velora Heckler, MD;  Location: [facility] ORS;  Service: General;  Laterality: N/A;   NASAL SINUS SURGERY  08/2010   Family History Family History  Problem Relation Age of Onset   Hypertension Mother  Heart disease Father    Cancer Paternal Grandmother        ovarian    Social History Social History   Tobacco Use   Smoking status: Never   Smokeless tobacco: Never   Tobacco comments:    Never smoke 09/28/21  Vaping Use   Vaping status: Never Used  Substance Use Topics   Alcohol use: No   Drug use: No   Allergies Patient has no known allergies.  Review of Systems Review of Systems  Constitutional:  Positive for fatigue.  Respiratory:  Positive for cough and shortness of breath.   Cardiovascular:  Positive for chest pain.  Musculoskeletal:  Positive for arthralgias and myalgias.    Physical Exam Vital Signs  I have reviewed the triage vital signs BP 110/85   Pulse 94   Temp 98.6 F (37 C) (Oral)   Resp 18   Ht 5\' 7"  (1.702 m)   Wt 128.4 kg   LMP 02/07/2023 (Approximate)   SpO2 99%   BMI 44.32 kg/m   Physical Exam Vitals and nursing note reviewed.  Constitutional:      General: She is not in acute distress.    Appearance: She is well-developed.  HENT:      Head: Normocephalic and atraumatic.  Eyes:     Conjunctiva/sclera: Conjunctivae normal.  Cardiovascular:     Rate and Rhythm: Normal rate and regular rhythm.     Heart sounds: No murmur heard. Pulmonary:     Effort: Pulmonary effort is normal. No respiratory distress.     Breath sounds: Normal breath sounds.  Abdominal:     Palpations: Abdomen is soft.     Tenderness: There is no abdominal tenderness.  Musculoskeletal:        General: No swelling.     Cervical back: Neck supple.  Skin:    General: Skin is warm and dry.     Capillary Refill: Capillary refill takes less than 2 seconds.  Neurological:     Mental Status: She is alert.  Psychiatric:        Mood and Affect: Mood normal.     ED Results and Treatments Labs (all labs ordered are listed, but only abnormal results are displayed) Labs Reviewed  BASIC METABOLIC PANEL  CBC  HCG, SERUM, QUALITATIVE  TROPONIN I (HIGH SENSITIVITY)  TROPONIN I (HIGH SENSITIVITY)                                                                                                                          Radiology DG Chest 2 View  Result Date: 05/17/2023 CLINICAL DATA:  Shortness of breath and chest pain. EXAM: CHEST - 2 VIEW COMPARISON:  11/11/2020 FINDINGS: Heart size and mediastinal contours are normal. Lung volumes are low. There is a new opacity within the medial right base which is concerning for right middle lobe pneumonia. The left lung appears clear. No pleural effusion or edema. Visualized osseous structures are unremarkable. IMPRESSION: New opacity within the  medial right base is concerning for right middle lobe pneumonia. Electronically Signed   By: Signa Kell M.D.   On: 05/17/2023 10:41    Pertinent labs & imaging results that were available during my care of the patient were reviewed by me and considered in my medical decision making (see MDM for details).  Medications Ordered in ED Medications  amoxicillin-clavulanate  (AUGMENTIN) 875-125 MG per tablet 1 tablet (1 tablet Oral Given 05/17/23 1100)  acetaminophen (TYLENOL) tablet 1,000 mg (1,000 mg Oral Given 05/17/23 1100)                                                                                                                                     Procedures Procedures  (including critical care time)  Medical Decision Making / ED Course   This patient presents to the ED for concern of dyspnea, this involves an extensive number of treatment options, and is a complaint that carries with it a high risk of complications and morbidity.  The differential diagnosis includes Pe, PTX, Pulmonary Edema, ARDS, COPD/Asthma, ACS, CHF exacerbation, Arrhythmia, Pericardial Effusion/Tamponade, Anemia, Sepsis, Acidosis/Hypercapnia, Anxiety, Viral URI  MDM: Patient seen emergency room for evaluation of shortness of breath.  Physical exam largely unremarkable.  Laboratory evaluation reassuringly unremarkable with no significant leukocytosis, high-sensitivity troponin is normal.  ECG nonischemic with normal sinus rhythm.  Chest x-ray concerning for pneumonia.  Patient started on Augmentin.  Patient currently not hypoxic and does not meet inpatient criteria for admission.  Will be discharged with a weeklong course of Augmentin.  Low suspicion for ACS his heart score is low.  Low suspicion for pulmonary embolism as she is low risk by Wells criteria.  Will be discharged with outpatient follow-up.  Return precautions given which she voiced understanding and patient discharged   Additional history obtained:  -External records from outside source obtained and reviewed including: Chart review including previous notes, labs, imaging, consultation notes   Lab Tests: -I ordered, reviewed, and interpreted labs.   The pertinent results include:   Labs Reviewed  BASIC METABOLIC PANEL  CBC  HCG, SERUM, QUALITATIVE  TROPONIN I (HIGH SENSITIVITY)  TROPONIN I (HIGH SENSITIVITY)       EKG   EKG Interpretation Date/Time:  Friday May 17 2023 09:17:50 EST Ventricular Rate:  106 PR Interval:  156 QRS Duration:  86 QT Interval:  342 QTC Calculation: 454 R Axis:   67  Text Interpretation: Sinus tachycardia Otherwise normal ECG When compared with ECG of 15-May-2023 15:50, PREVIOUS ECG IS PRESENT Confirmed by Shondrea Steinert ([id number]) on 05/17/2023 11:06:13 AM         Imaging Studies ordered: I ordered imaging studies including chest x-ray I independently visualized and interpreted imaging. I agree with the radiologist interpretation   Medicines ordered and prescription drug management: Meds ordered this encounter  Medications   amoxicillin-clavulanate (AUGMENTIN) 875-125 MG per tablet 1 tablet   acetaminophen (TYLENOL)  tablet 1,000 mg   amoxicillin-clavulanate (AUGMENTIN) 875-125 MG tablet    Sig: Take 1 tablet by mouth every 12 (twelve) hours for 7 days    Dispense:  14 tablet    Refill:  0    -I have reviewed the patients home medicines and have made adjustments as needed  Critical interventions none    Cardiac Monitoring: The patient was maintained on a cardiac monitor.  I personally viewed and interpreted the cardiac monitored which showed an underlying rhythm of: NSR  Social Determinants of Health:  Factors impacting patients care include: none   Reevaluation: After the interventions noted above, I reevaluated the patient and found that they have :improved  Co morbidities that complicate the patient evaluation  Past Medical History:  Diagnosis Date   Anxiety    xanax in past, pt states this is no longer a problem   Atrial fibrillation (HCC)    Headache    Hypertension    Nausea    Weakness       Dispostion: I considered admission for this patient, but at this time she does not meet inpatient criteria for admission and will be discharged with outpatient follow-up     Final Clinical Impression(s) / ED Diagnoses Final  diagnoses:  Pneumonia due to infectious organism, unspecified laterality, unspecified part of lung     @PCDICTATION @    Glendora Score, MD 05/17/23 Paulo Fruit

## 2023-05-20 ENCOUNTER — Ambulatory Visit (HOSPITAL_COMMUNITY): Payer: Commercial Managed Care - PPO | Admitting: Physician Assistant

## 2023-05-20 LAB — METANEPHRINES, PLASMA
Metanephrine, Free: 44.5 pg/mL (ref 0.0–88.0)
Normetanephrine, Free: 157.8 pg/mL (ref 0.0–210.1)

## 2023-05-20 LAB — COMPREHENSIVE METABOLIC PANEL
ALT: 35 [IU]/L — ABNORMAL HIGH (ref 0–32)
AST: 19 [IU]/L (ref 0–40)
Albumin: 4.4 g/dL (ref 4.0–5.0)
Alkaline Phosphatase: 86 [IU]/L (ref 44–121)
BUN/Creatinine Ratio: 18 (ref 9–23)
BUN: 13 mg/dL (ref 6–20)
Bilirubin Total: 0.4 mg/dL (ref 0.0–1.2)
CO2: 25 mmol/L (ref 20–29)
Calcium: 9.5 mg/dL (ref 8.7–10.2)
Chloride: 102 mmol/L (ref 96–106)
Creatinine, Ser: 0.74 mg/dL (ref 0.57–1.00)
Globulin, Total: 2.6 g/dL (ref 1.5–4.5)
Glucose: 100 mg/dL — ABNORMAL HIGH (ref 70–99)
Potassium: 4.5 mmol/L (ref 3.5–5.2)
Sodium: 139 mmol/L (ref 134–144)
Total Protein: 7 g/dL (ref 6.0–8.5)
eGFR: 112 mL/min/{1.73_m2} (ref >59)

## 2023-05-20 LAB — CATECHOLAMINES, FRACTIONATED, PLASMA
Dopamine: <30 pg/mL (ref 0–48)
Epinephrine: 74 pg/mL — ABNORMAL HIGH (ref 0–62)
Norepinephrine: 960 pg/mL — ABNORMAL HIGH (ref 0–874)

## 2023-05-20 LAB — TSH: TSH: 0.838 u[IU]/mL (ref 0.450–4.500)

## 2023-05-21 DIAGNOSIS — B370 Candidal stomatitis: Secondary | ICD-10-CM | POA: Diagnosis not present

## 2023-05-21 DIAGNOSIS — J159 Unspecified bacterial pneumonia: Secondary | ICD-10-CM | POA: Diagnosis not present

## 2023-05-24 DIAGNOSIS — J159 Unspecified bacterial pneumonia: Secondary | ICD-10-CM | POA: Diagnosis not present

## 2023-05-27 DIAGNOSIS — J159 Unspecified bacterial pneumonia: Secondary | ICD-10-CM | POA: Diagnosis not present

## 2023-05-30 ENCOUNTER — Other Ambulatory Visit (HOSPITAL_COMMUNITY): Payer: Self-pay

## 2023-05-30 ENCOUNTER — Other Ambulatory Visit: Payer: Self-pay | Admitting: Cardiovascular Disease

## 2023-05-30 DIAGNOSIS — I159 Secondary hypertension, unspecified: Secondary | ICD-10-CM

## 2023-05-31 ENCOUNTER — Other Ambulatory Visit: Payer: Self-pay

## 2023-05-31 ENCOUNTER — Other Ambulatory Visit (HOSPITAL_COMMUNITY): Payer: Self-pay

## 2023-05-31 MED ORDER — OLMESARTAN MEDOXOMIL 20 MG PO TABS
20.0000 mg | ORAL_TABLET | Freq: Every day | ORAL | 3 refills | Status: DC
  Filled 2023-05-31: qty 90, 90d supply, fill #0

## 2023-06-03 ENCOUNTER — Other Ambulatory Visit: Payer: Self-pay

## 2023-07-12 ENCOUNTER — Other Ambulatory Visit: Payer: Self-pay

## 2023-07-22 ENCOUNTER — Encounter: Payer: Self-pay | Admitting: Cardiovascular Disease

## 2023-07-22 DIAGNOSIS — R059 Cough, unspecified: Secondary | ICD-10-CM | POA: Diagnosis not present

## 2023-07-22 DIAGNOSIS — Z1331 Encounter for screening for depression: Secondary | ICD-10-CM | POA: Diagnosis not present

## 2023-07-22 DIAGNOSIS — I4891 Unspecified atrial fibrillation: Secondary | ICD-10-CM | POA: Diagnosis not present

## 2023-07-22 DIAGNOSIS — J101 Influenza due to other identified influenza virus with other respiratory manifestations: Secondary | ICD-10-CM | POA: Diagnosis not present

## 2023-07-22 DIAGNOSIS — J069 Acute upper respiratory infection, unspecified: Secondary | ICD-10-CM | POA: Diagnosis not present

## 2023-07-22 DIAGNOSIS — R509 Fever, unspecified: Secondary | ICD-10-CM | POA: Diagnosis not present

## 2023-07-22 NOTE — Telephone Encounter (Signed)
I would be glad to make that referral

## 2023-07-30 ENCOUNTER — Encounter: Payer: Commercial Managed Care - PPO | Admitting: Family Medicine

## 2023-09-09 DIAGNOSIS — A09 Infectious gastroenteritis and colitis, unspecified: Secondary | ICD-10-CM | POA: Diagnosis not present

## 2023-09-19 DIAGNOSIS — B9689 Other specified bacterial agents as the cause of diseases classified elsewhere: Secondary | ICD-10-CM | POA: Diagnosis not present

## 2023-09-19 DIAGNOSIS — J019 Acute sinusitis, unspecified: Secondary | ICD-10-CM | POA: Diagnosis not present

## 2023-09-27 ENCOUNTER — Ambulatory Visit: Admitting: Physician Assistant

## 2023-10-13 IMAGING — US US BREAST*L* LIMITED INC AXILLA
1 series · 4 of 4 positions shown · non-contrast
Comparison: None.

CLINICAL DATA: Bilateral diffuse intermittent breast pain.

EXAM:
ULTRASOUND OF THE BILATERAL BREAST

[Series 1: us breast*left* limited inc axilla · 0.09mm/px · 4 of 4 slices shown]
[im 1/4]
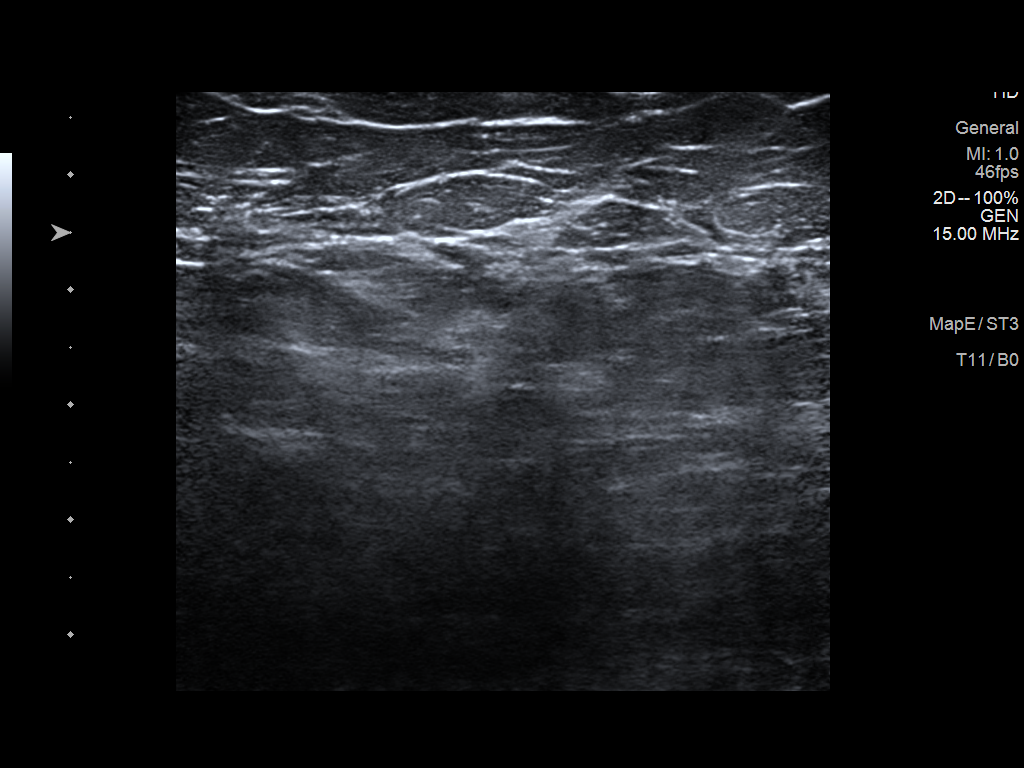
[im 2/4]
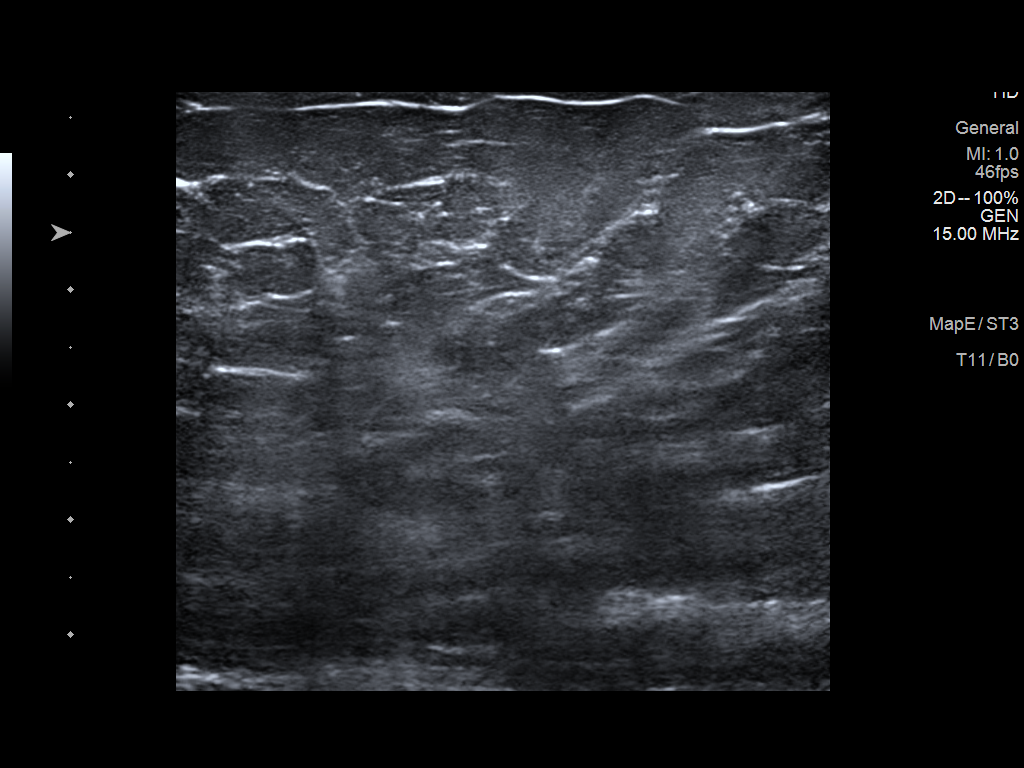
[im 3/4]
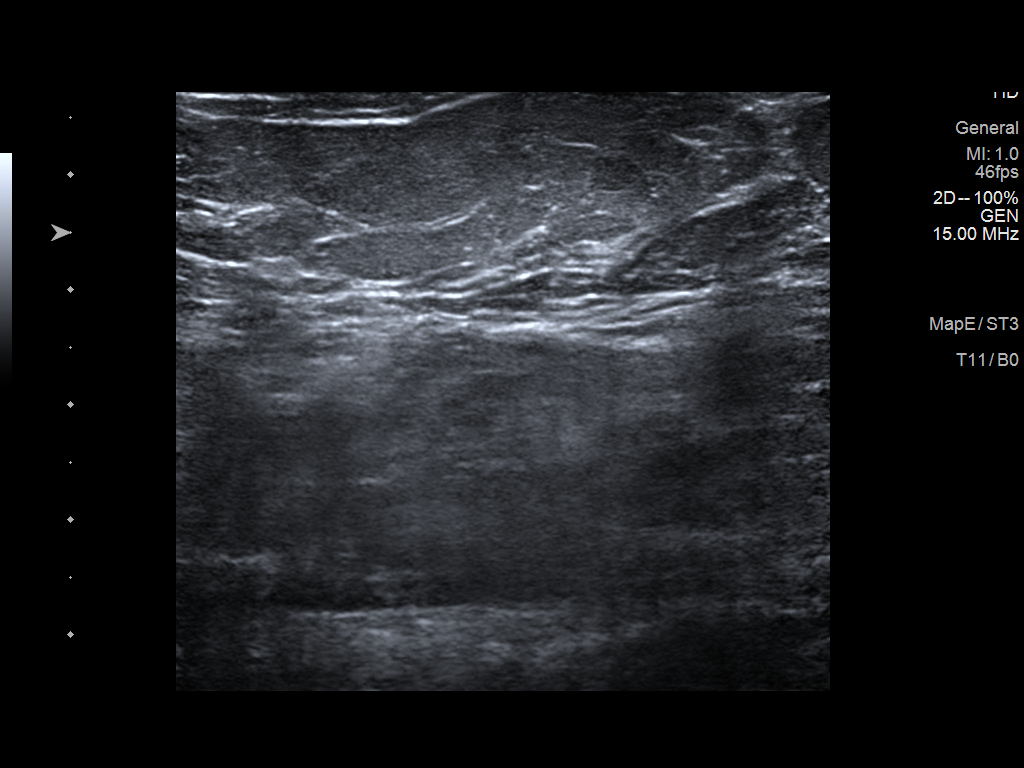
[im 4/4]
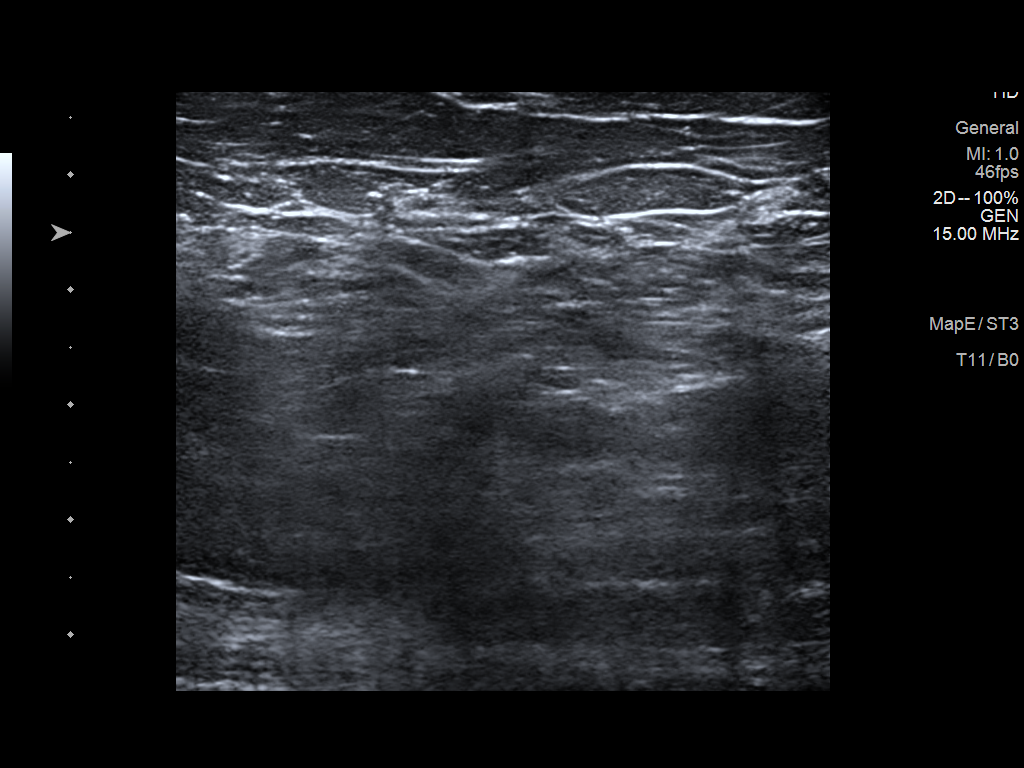

[4 of 4 positions shown; findings below may reference images not displayed]

FINDINGS: Targeted ultrasound is performed, showing no cause for the patient's
symptoms.
IMPRESSION: No sonographic evidence of malignancy. No cause for the patient's
symptoms identified.

RECOMMENDATION:
Treatment of the patient's symptoms should be based on clinical and
physical exam given lack of imaging findings. Recommend annual
screening mammography beginning at the age of 40.

I have discussed the findings and recommendations with the patient.
If applicable, a reminder letter will be sent to the patient
regarding the next appointment.

BI-RADS CATEGORY  1: Negative.

## 2024-01-13 ENCOUNTER — Encounter: Payer: Self-pay | Admitting: Cardiovascular Disease

## 2024-01-13 ENCOUNTER — Other Ambulatory Visit (HOSPITAL_COMMUNITY): Payer: Self-pay

## 2024-01-13 ENCOUNTER — Ambulatory Visit: Attending: Cardiovascular Disease | Admitting: Cardiovascular Disease

## 2024-01-13 VITALS — BP 171/120 | HR 104 | Ht 67.0 in | Wt 285.4 lb

## 2024-01-13 DIAGNOSIS — I4891 Unspecified atrial fibrillation: Secondary | ICD-10-CM

## 2024-01-13 DIAGNOSIS — I10 Essential (primary) hypertension: Secondary | ICD-10-CM

## 2024-01-13 DIAGNOSIS — Z9889 Other specified postprocedural states: Secondary | ICD-10-CM | POA: Diagnosis not present

## 2024-01-13 DIAGNOSIS — R0602 Shortness of breath: Secondary | ICD-10-CM

## 2024-01-13 DIAGNOSIS — Z6841 Body Mass Index (BMI) 40.0 and over, adult: Secondary | ICD-10-CM

## 2024-01-13 DIAGNOSIS — E66813 Obesity, class 3: Secondary | ICD-10-CM | POA: Diagnosis not present

## 2024-01-13 DIAGNOSIS — D3500 Benign neoplasm of unspecified adrenal gland: Secondary | ICD-10-CM | POA: Diagnosis not present

## 2024-01-13 MED ORDER — OLMESARTAN MEDOXOMIL 20 MG PO TABS
20.0000 mg | ORAL_TABLET | Freq: Every day | ORAL | 3 refills | Status: AC
  Filled 2024-01-13: qty 90, 90d supply, fill #0
  Filled 2024-04-07: qty 90, 90d supply, fill #1
  Filled 2024-07-13: qty 90, 90d supply, fill #2

## 2024-01-13 MED ORDER — AMLODIPINE BESYLATE 5 MG PO TABS
5.0000 mg | ORAL_TABLET | Freq: Every day | ORAL | 3 refills | Status: AC
  Filled 2024-01-13: qty 90, 90d supply, fill #0
  Filled 2024-04-07: qty 90, 90d supply, fill #1
  Filled 2024-07-13: qty 90, 90d supply, fill #2

## 2024-01-13 MED ORDER — HYDROCHLOROTHIAZIDE 12.5 MG PO CAPS
12.5000 mg | ORAL_CAPSULE | Freq: Every day | ORAL | 3 refills | Status: AC
  Filled 2024-01-13: qty 90, 90d supply, fill #0
  Filled 2024-04-07: qty 90, 90d supply, fill #1
  Filled 2024-07-13: qty 90, 90d supply, fill #2

## 2024-01-13 NOTE — Progress Notes (Signed)
 Cardiology Office Note:  .   Date:  01/13/2024  ID:  Eileen Buck, DOB 10-Apr-1996, MRN [id number] PCP: Teresa Aldona CROME, NP  [facility] Providers Cardiologist:  Jerel Balding, MD Electrophysiologist:  Will Gladis Norton, MD    History of Present Illness: .   Eileen Buck is a 28 y.o. female with surprisingly early onset of paroxysmal atrial fibrillation s/p radiofrequency ablation in 2023 without recurrence, severe hypertension without identifiable secondary cause, morbid obesity.  She has mild obstructive sleep apnea that only manifests when she sleeps on her back, not on CPAP.  She was taking numerous medications for blood pressure control including high-dose carvedilol , diltiazem , olmesartan  and hydrochlorothiazide .  She felt lack of energy and fatigue and decided to stop all her medications about 5 months ago.  Since then her blood pressure has been severely elevated around 170/110 - 120.  She has a mild headache but no other focal neurological complaints.  She has fatigue and mild exertional dyspnea.  She is no longer on anticoagulation.  Her smart watch has not shown any recurrence of atrial fib.   ROS: Denies lower extremity edema, orthopnea, PND, chest pain, syncope or focal neurological complaints.  Studies Reviewed: SABRA   EKG Interpretation Date/Time:  Monday January 13 2024 10:21:05 EDT Ventricular Rate:  104 PR Interval:  156 QRS Duration:  88 QT Interval:  362 QTC Calculation: 476 R Axis:   58  Text Interpretation: Sinus tachycardia When compared with ECG of 17-May-2023 09:17, No significant change was found Confirmed by Syanna Remmert ([id number]) on 01/13/2024 10:23:19 AM     Risk Assessment/Calculations:    CHA2DS2-VASc Score =   2 (HTN, gender). Successful ablation, anticoagulants stopped  This indicates a  % annual risk of stroke. The patient's score is based upon:       HYPERTENSION CONTROL Vitals:   01/13/24 1007 01/13/24 1210  BP: (!) 170/124 (!)  171/120    The patient's blood pressure is elevated above target today.  In order to address the patient's elevated BP: A new medication was prescribed today.          Physical Exam:   VS:  BP (!) 171/120   Pulse (!) 104   Ht 5' 7 (1.702 m)   Wt 285 lb 6.4 oz (129.5 kg)   SpO2 98%   BMI 44.70 kg/m    Wt Readings from Last 3 Encounters:  01/13/24 285 lb 6.4 oz (129.5 kg)  05/17/23 283 lb (128.4 kg)  05/15/23 283 lb (128.4 kg)    GEN: Well nourished, well developed in no acute distress.  Looks a little flushed NECK: No JVD; No carotid bruits CARDIAC: Tachycardia, RRR, no murmurs, rubs, gallops RESPIRATORY:  Clear to auscultation without rales, wheezing or rhonchi  ABDOMEN: Soft, non-tender, non-distended EXTREMITIES:  No edema; No deformity   ASSESSMENT AND PLAN: .    HTN: severe SBP and DBP elevation, off all meds. Had normal renin-aldosterone levels, borderline elevation in serum cathecholamines. Mom has HTN and CHF with low EF, recovered on meds. Suspect she will need 4 drugs for BP control. Start olmesartan  and hydrochlorothiazide , start carvedilol  after the 24 h collection. Send BP log in 2 weeks. Start amlodipine  after that.  Check echo for dyspnea, reassess LV function. Sinus tachycardia: a persistent manifestation. 24 h urine collection for VMA and metanephrines was never completed.    It has always been surprising that her blood pressures so hard to treat and that she has had arrhythmia  such a young age.  Obviously her obesity could have something to do with this, but I think we need to screen more carefully for pheochromocytoma.  Reorder the 24-hour urine collection, now off beta blockers.   AFib: No recurrence since successful ablation in July 2023.  Off anticoagulants since July 2024.   Obesity: As previously discussed, weight loss would be highly beneficial, and she reports commitment to weight loss efforts.    Signed, Jerel Balding, MD

## 2024-01-13 NOTE — Patient Instructions (Addendum)
 Medication Instructions:  Your physician has recommended you make the following change in your medication:  START: Olmesartan  20 mg once daily  START: Hydrochlorothiazide  12.5 mg once daily  START: Amlodipine  5 mg once daily  After 24 hour urine start: Carvedilol  6.25 mg (as prescribed by other provider)  *If you need a refill on your cardiac medications before your next appointment, please call your pharmacy*  Lab Work: 24 hr urine  If you have labs (blood work) drawn today and your tests are completely normal, you will receive your results only by: MyChart Message (if you have MyChart) OR A paper copy in the mail If you have any lab test that is abnormal or we need to change your treatment, we will call you to review the results.  Testing/Procedures: Your physician has requested that you have an echocardiogram. Echocardiography is a painless test that uses sound waves to create images of your heart. It provides your doctor with information about the size and shape of your heart and how well your heart's chambers and valves are working. This procedure takes approximately one hour. There are no restrictions for this procedure. Please do NOT wear cologne, perfume, aftershave, or lotions (deodorant is allowed). Please arrive 15 minutes prior to your appointment time.  Please note: We ask at that you not bring children with you during ultrasound (echo/ vascular) testing. Due to room size and safety concerns, children are not allowed in the ultrasound rooms during exams. Our front office staff cannot provide observation of children in our lobby area while testing is being conducted. An adult accompanying a patient to their appointment will only be allowed in the ultrasound room at the discretion of the ultrasound technician under special circumstances. We apologize for any inconvenience.   Follow-Up: At [facility], you and your health needs are our priority.  As part of our continuing  mission to provide you with exceptional heart care, our providers are all part of one team.  This team includes your primary Cardiologist (physician) and Advanced Practice Providers or APPs (Physician Assistants and Nurse Practitioners) who all work together to provide you with the care you need, when you need it.  Your next appointment:   1 month(s)  Provider:   Jerel Balding, MD     Other Instructions Please take your blood pressure daily for 2 weeks and send in a MyChart message. Please include heart rates. (One message at the end of the 2 weeks).   HOW TO TAKE YOUR BLOOD PRESSURE: Rest 5 minutes before taking your blood pressure. Don't smoke or drink caffeinated beverages for at least 30 minutes before. Take your blood pressure before (not after) you eat. Sit comfortably with your back supported and both feet on the floor (don't cross your legs). Elevate your arm to heart level on a table or a desk. Use the proper sized cuff. It should fit smoothly and snugly around your bare upper arm. There should be enough room to slip a fingertip under the cuff. The bottom edge of the cuff should be 1 inch above the crease of the elbow. Ideally, take 3 measurements at one sitting and record the average.

## 2024-01-17 DIAGNOSIS — D3500 Benign neoplasm of unspecified adrenal gland: Secondary | ICD-10-CM | POA: Diagnosis not present

## 2024-01-22 ENCOUNTER — Encounter: Payer: Self-pay | Admitting: Cardiovascular Disease

## 2024-01-22 ENCOUNTER — Other Ambulatory Visit (HOSPITAL_COMMUNITY): Payer: Self-pay

## 2024-01-22 MED ORDER — CARVEDILOL 12.5 MG PO TABS
12.5000 mg | ORAL_TABLET | Freq: Two times a day (BID) | ORAL | 3 refills | Status: AC
  Filled 2024-01-22 – 2024-01-27 (×2): qty 180, 90d supply, fill #0
  Filled 2024-04-23: qty 180, 90d supply, fill #1
  Filled 2024-07-21: qty 180, 90d supply, fill #2

## 2024-01-22 NOTE — Telephone Encounter (Signed)
   MC Mihai Croitoru, MD Blood pressure starting look better.  Surprising how fast your heart rate still is.  Please increase the carvedilol  to 12.5 mg twice daily (that is exactly double what you are on now).  Will send in a prescription for the new dose.  New Prescription sent

## 2024-01-23 LAB — METANEPHRINES, URINE, 24 HOUR
Metaneph Total, Ur: 24 ug/L
Metanephrines, 24H Ur: 72 ug/(24.h) (ref 36–209)
Normetanephrine, 24H Ur: 237 ug/(24.h) (ref 95–449)
Normetanephrine, Ur: 79 ug/L

## 2024-01-24 ENCOUNTER — Ambulatory Visit (HOSPITAL_COMMUNITY)
Admission: RE | Admit: 2024-01-24 | Discharge: 2024-01-24 | Disposition: A | Discharge status: Home / Self Care | Source: Ambulatory Visit | Attending: Cardiovascular Disease | Admitting: Cardiovascular Disease

## 2024-01-24 ENCOUNTER — Ambulatory Visit: Payer: Self-pay | Admitting: Cardiovascular Disease

## 2024-01-24 DIAGNOSIS — R0602 Shortness of breath: Secondary | ICD-10-CM | POA: Insufficient documentation

## 2024-01-24 LAB — ECHOCARDIOGRAM COMPLETE: S' Lateral: 2.7 cm

## 2024-01-24 MED ORDER — PERFLUTREN LIPID MICROSPHERE
1.0000 mL | INTRAVENOUS | Status: AC | PRN
  Administered 2024-01-24: 2 mL via INTRAVENOUS

## 2024-01-27 ENCOUNTER — Other Ambulatory Visit (HOSPITAL_COMMUNITY): Payer: Self-pay

## 2024-01-27 ENCOUNTER — Encounter: Payer: Self-pay | Admitting: Gastroenterology

## 2024-01-27 ENCOUNTER — Other Ambulatory Visit: Payer: Self-pay

## 2024-02-05 DIAGNOSIS — M25531 Pain in right wrist: Secondary | ICD-10-CM | POA: Diagnosis not present

## 2024-02-21 ENCOUNTER — Observation Stay (HOSPITAL_COMMUNITY)
Admission: EM | Admit: 2024-02-21 | Discharge: 2024-02-23 | Disposition: A | Discharge status: Home / Self Care | Attending: Internal Medicine | Admitting: Internal Medicine

## 2024-02-21 ENCOUNTER — Encounter (HOSPITAL_BASED_OUTPATIENT_CLINIC_OR_DEPARTMENT_OTHER): Payer: Self-pay

## 2024-02-21 ENCOUNTER — Other Ambulatory Visit (HOSPITAL_COMMUNITY)

## 2024-02-21 ENCOUNTER — Other Ambulatory Visit: Payer: Self-pay

## 2024-02-21 DIAGNOSIS — Z79899 Other long term (current) drug therapy: Secondary | ICD-10-CM | POA: Diagnosis not present

## 2024-02-21 DIAGNOSIS — Z7901 Long term (current) use of anticoagulants: Secondary | ICD-10-CM | POA: Insufficient documentation

## 2024-02-21 DIAGNOSIS — I2699 Other pulmonary embolism without acute cor pulmonale: Principal | ICD-10-CM | POA: Diagnosis present

## 2024-02-21 DIAGNOSIS — I480 Paroxysmal atrial fibrillation: Secondary | ICD-10-CM | POA: Diagnosis present

## 2024-02-21 DIAGNOSIS — R188 Other ascites: Secondary | ICD-10-CM | POA: Diagnosis not present

## 2024-02-21 DIAGNOSIS — R079 Chest pain, unspecified: Secondary | ICD-10-CM | POA: Diagnosis not present

## 2024-02-21 DIAGNOSIS — R0789 Other chest pain: Secondary | ICD-10-CM | POA: Diagnosis not present

## 2024-02-21 DIAGNOSIS — D72829 Elevated white blood cell count, unspecified: Secondary | ICD-10-CM | POA: Diagnosis present

## 2024-02-21 DIAGNOSIS — I10 Essential (primary) hypertension: Secondary | ICD-10-CM | POA: Diagnosis not present

## 2024-02-21 DIAGNOSIS — I2693 Single subsegmental pulmonary embolism without acute cor pulmonale: Secondary | ICD-10-CM | POA: Diagnosis not present

## 2024-02-21 DIAGNOSIS — E66813 Obesity, class 3: Secondary | ICD-10-CM | POA: Diagnosis present

## 2024-02-21 DIAGNOSIS — R0781 Pleurodynia: Secondary | ICD-10-CM | POA: Diagnosis not present

## 2024-02-21 DIAGNOSIS — Z6841 Body Mass Index (BMI) 40.0 and over, adult: Secondary | ICD-10-CM | POA: Insufficient documentation

## 2024-02-21 NOTE — ED Triage Notes (Signed)
 Patient reports right upper chest pain this afternoon , denies SOB , no emesis or diaphoresis .

## 2024-02-22 ENCOUNTER — Emergency Department (HOSPITAL_COMMUNITY)

## 2024-02-22 ENCOUNTER — Encounter (HOSPITAL_COMMUNITY): Payer: Self-pay

## 2024-02-22 ENCOUNTER — Encounter (HOSPITAL_COMMUNITY)

## 2024-02-22 ENCOUNTER — Observation Stay (HOSPITAL_BASED_OUTPATIENT_CLINIC_OR_DEPARTMENT_OTHER)

## 2024-02-22 DIAGNOSIS — I2609 Other pulmonary embolism with acute cor pulmonale: Secondary | ICD-10-CM

## 2024-02-22 DIAGNOSIS — R188 Other ascites: Secondary | ICD-10-CM | POA: Diagnosis not present

## 2024-02-22 DIAGNOSIS — D72829 Elevated white blood cell count, unspecified: Secondary | ICD-10-CM | POA: Diagnosis not present

## 2024-02-22 DIAGNOSIS — R0781 Pleurodynia: Secondary | ICD-10-CM | POA: Diagnosis not present

## 2024-02-22 DIAGNOSIS — I159 Secondary hypertension, unspecified: Secondary | ICD-10-CM | POA: Diagnosis not present

## 2024-02-22 DIAGNOSIS — I480 Paroxysmal atrial fibrillation: Secondary | ICD-10-CM | POA: Diagnosis not present

## 2024-02-22 DIAGNOSIS — R079 Chest pain, unspecified: Secondary | ICD-10-CM | POA: Diagnosis not present

## 2024-02-22 DIAGNOSIS — E66813 Obesity, class 3: Secondary | ICD-10-CM | POA: Diagnosis present

## 2024-02-22 DIAGNOSIS — I2699 Other pulmonary embolism without acute cor pulmonale: Secondary | ICD-10-CM | POA: Diagnosis present

## 2024-02-22 DIAGNOSIS — I2693 Single subsegmental pulmonary embolism without acute cor pulmonale: Secondary | ICD-10-CM | POA: Diagnosis present

## 2024-02-22 LAB — ECHOCARDIOGRAM COMPLETE
Area-P 1/2: 5.38 cm2
Height: 67 in
S' Lateral: 2.9 cm
Weight: 4560.88 [oz_av]

## 2024-02-22 LAB — BASIC METABOLIC PANEL WITH GFR
Anion gap: 7 (ref 5–15)
BUN: 14 mg/dL (ref 6–20)
CO2: 27 mmol/L (ref 22–32)
Calcium: 9.4 mg/dL (ref 8.9–10.3)
Chloride: 105 mmol/L (ref 98–111)
Creatinine, Ser: 0.69 mg/dL (ref 0.44–1.00)
GFR, Estimated: >60 mL/min (ref >60)
Glucose, Bld: 106 mg/dL — ABNORMAL HIGH (ref 70–99)
Potassium: 3.9 mmol/L (ref 3.5–5.1)
Sodium: 139 mmol/L (ref 135–145)

## 2024-02-22 LAB — CBC
HCT: 42.3 % (ref 36.0–46.0)
Hemoglobin: 13.7 g/dL (ref 12.0–15.0)
MCH: 28.4 pg (ref 26.0–34.0)
MCHC: 32.4 g/dL (ref 30.0–36.0)
MCV: 87.8 fL (ref 80.0–100.0)
Platelets: 240 K/uL (ref 150–400)
RBC: 4.82 MIL/uL (ref 3.87–5.11)
RDW: 13.7 % (ref 11.5–15.5)
WBC: 11.8 K/uL — ABNORMAL HIGH (ref 4.0–10.5)
nRBC: 0 % (ref 0.0–0.2)

## 2024-02-22 LAB — TROPONIN I (HIGH SENSITIVITY)
Troponin I (High Sensitivity): 4 ng/L (ref <18)
Troponin I (High Sensitivity): 4 ng/L (ref <18)

## 2024-02-22 LAB — HCG, SERUM, QUALITATIVE: Preg, Serum: NEGATIVE

## 2024-02-22 LAB — HEPARIN LEVEL (UNFRACTIONATED): Heparin Unfractionated: 0.33 [IU]/mL (ref 0.30–0.70)

## 2024-02-22 LAB — HIV ANTIBODY (ROUTINE TESTING W REFLEX): HIV Screen 4th Generation wRfx: NONREACTIVE

## 2024-02-22 MED ORDER — PERFLUTREN LIPID MICROSPHERE
1.0000 mL | INTRAVENOUS | Status: AC | PRN
  Administered 2024-02-22: 3 mL via INTRAVENOUS

## 2024-02-22 MED ORDER — IOHEXOL 350 MG/ML SOLN
75.0000 mL | Freq: Once | INTRAVENOUS | Status: AC | PRN
  Administered 2024-02-22: 75 mL via INTRAVENOUS

## 2024-02-22 MED ORDER — MORPHINE SULFATE (PF) 4 MG/ML IV SOLN
4.0000 mg | Freq: Once | INTRAVENOUS | Status: AC
  Administered 2024-02-22: 4 mg via INTRAVENOUS
  Filled 2024-02-22: qty 1

## 2024-02-22 MED ORDER — ACETAMINOPHEN 650 MG RE SUPP: 650.0000 mg | Freq: Four times a day (QID) | RECTAL | Status: DC | PRN

## 2024-02-22 MED ORDER — APIXABAN 5 MG PO TABS: 5.0000 mg | ORAL_TABLET | Freq: Two times a day (BID) | ORAL | Status: DC

## 2024-02-22 MED ORDER — HYDROCHLOROTHIAZIDE 12.5 MG PO TABS
12.5000 mg | ORAL_TABLET | Freq: Every day | ORAL | Status: DC
  Administered 2024-02-22 – 2024-02-23 (×2): 12.5 mg via ORAL
  Filled 2024-02-22 (×3): qty 1

## 2024-02-22 MED ORDER — HEPARIN (PORCINE) 25000 UT/250ML-% IV SOLN
1700.0000 [IU]/h | INTRAVENOUS | Status: DC
  Administered 2024-02-22 (×2): 1700 [IU]/h via INTRAVENOUS
  Filled 2024-02-22: qty 250

## 2024-02-22 MED ORDER — CARVEDILOL 12.5 MG PO TABS
12.5000 mg | ORAL_TABLET | Freq: Two times a day (BID) | ORAL | Status: DC
  Administered 2024-02-22 – 2024-02-23 (×3): 12.5 mg via ORAL
  Filled 2024-02-22 (×3): qty 1

## 2024-02-22 MED ORDER — HYDROCODONE-ACETAMINOPHEN 5-325 MG PO TABS
1.0000 | ORAL_TABLET | Freq: Four times a day (QID) | ORAL | Status: DC | PRN
  Administered 2024-02-22 – 2024-02-23 (×4): 1 via ORAL
  Filled 2024-02-22 (×4): qty 1

## 2024-02-22 MED ORDER — AMLODIPINE BESYLATE 5 MG PO TABS
5.0000 mg | ORAL_TABLET | Freq: Every day | ORAL | Status: DC
  Administered 2024-02-22 – 2024-02-23 (×2): 5 mg via ORAL
  Filled 2024-02-22 (×2): qty 1

## 2024-02-22 MED ORDER — APIXABAN 5 MG PO TABS
10.0000 mg | ORAL_TABLET | Freq: Two times a day (BID) | ORAL | Status: DC
  Administered 2024-02-22 – 2024-02-23 (×3): 10 mg via ORAL
  Filled 2024-02-22 (×3): qty 2

## 2024-02-22 MED ORDER — ACETAMINOPHEN 325 MG PO TABS: 650.0000 mg | ORAL_TABLET | Freq: Four times a day (QID) | ORAL | Status: DC | PRN

## 2024-02-22 MED ORDER — IRBESARTAN 150 MG PO TABS
150.0000 mg | ORAL_TABLET | Freq: Every day | ORAL | Status: DC
  Administered 2024-02-22 – 2024-02-23 (×2): 150 mg via ORAL
  Filled 2024-02-22 (×2): qty 1

## 2024-02-22 MED ORDER — SODIUM CHLORIDE 0.9% FLUSH
3.0000 mL | Freq: Two times a day (BID) | INTRAVENOUS | Status: DC
  Administered 2024-02-22 – 2024-02-23 (×2): 3 mL via INTRAVENOUS

## 2024-02-22 MED ORDER — HYDRALAZINE HCL 20 MG/ML IJ SOLN: 10.0000 mg | INTRAMUSCULAR | Status: DC | PRN

## 2024-02-22 MED ORDER — HEPARIN BOLUS VIA INFUSION
5000.0000 [IU] | Freq: Once | INTRAVENOUS | Status: AC
  Administered 2024-02-22: 5000 [IU] via INTRAVENOUS
  Filled 2024-02-22: qty 5000

## 2024-02-22 MED ORDER — MORPHINE SULFATE (PF) 2 MG/ML IV SOLN
2.0000 mg | INTRAVENOUS | Status: DC | PRN
  Administered 2024-02-22 – 2024-02-23 (×3): 2 mg via INTRAVENOUS
  Filled 2024-02-22 (×4): qty 1

## 2024-02-22 MED ORDER — FENTANYL CITRATE PF 50 MCG/ML IJ SOSY
50.0000 ug | PREFILLED_SYRINGE | Freq: Once | INTRAMUSCULAR | Status: AC
  Administered 2024-02-22: 50 ug via INTRAVENOUS
  Filled 2024-02-22: qty 1

## 2024-02-22 NOTE — Plan of Care (Signed)

## 2024-02-22 NOTE — ED Provider Notes (Cosign Needed Addendum)
 [place] EMERGENCY DEPARTMENT AT [facility] Provider Note   CSN: [id number] Arrival date & time: 02/21/24  2326     Patient presents with: Chest Pain   Eileen Buck is a 28 y.o. female.   The history is provided by the patient and medical records.  Chest Pain  28 year old female with history of A-fib status post ablation, hypertension, presenting to the ED for right upper chest pain.  States pain is sharp and stabbing in nature.  Also feels like her heart rate is a little elevated from baseline.  She thought maybe she had some acid reflux so tried some home omeprazole and Tums without much relief.  Has since started having some pain in her right scapular area as well.  She denies any shortness of breath but does report pain is worse with deep breathing.  She is not had any nausea, vomiting, or diaphoresis.  She denies any prior history of DVT or PE.  Mother does have history of recurrent clots and is currently on Eliquis .  Unclear if this is due to clotting disorder or other etiology.  Patient was on anticoagulation for a brief period after her ablation but was taken off this a few months ago.  She does report her job is rather sedentary but denies any exogenous estrogens, recent travel, or surgeries.  Prior to Admission medications   Medication Sig Start Date End Date Taking? Authorizing Provider  amLODipine  (NORVASC ) 5 MG tablet Take 1 tablet (5 mg total) by mouth daily. 01/13/24 04/12/24  Croitoru, Mihai, MD  carvedilol  (COREG ) 12.5 MG tablet Take 1 tablet (12.5 mg total) by mouth 2 (two) times daily with a meal. 01/22/24   Croitoru, Mihai, MD  hydrochlorothiazide  (MICROZIDE ) 12.5 MG capsule Take 1 capsule (12.5 mg total) by mouth daily. 01/13/24 04/12/24  Croitoru, Mihai, MD  olmesartan  (BENICAR ) 20 MG tablet Take 1 tablet (20 mg total) by mouth daily. 01/13/24   Croitoru, Jerel, MD    Allergies: Patient has no known allergies.    Review of Systems  Cardiovascular:   Positive for chest pain.  All other systems reviewed and are negative.   Updated Vital Signs BP (!) 150/99 (BP Location: Right Arm)   Pulse (!) 111   Temp 98 F (36.7 C)   Resp 18   SpO2 98%   Physical Exam Vitals and nursing note reviewed.  Constitutional:      Appearance: She is well-developed.  HENT:     Head: Normocephalic and atraumatic.  Eyes:     Conjunctiva/sclera: Conjunctivae normal.     Pupils: Pupils are equal, round, and reactive to light.  Cardiovascular:     Rate and Rhythm: Normal rate and regular rhythm.     Heart sounds: Normal heart sounds.  Pulmonary:     Effort: Pulmonary effort is normal.     Breath sounds: Normal breath sounds.     Comments: Chest wall is nontender, pain is induced with deep breathing but lungs are clear without any wheezes or rhonchi Abdominal:     General: Bowel sounds are normal.     Palpations: Abdomen is soft.  Musculoskeletal:        General: Normal range of motion.     Cervical back: Normal range of motion.  Skin:    General: Skin is warm and dry.  Neurological:     Mental Status: She is alert and oriented to person, place, and time.     (all labs ordered are listed, but only  abnormal results are displayed) Labs Reviewed  BASIC METABOLIC PANEL WITH GFR - Abnormal; Notable for the following components:      Result Value   Glucose, Bld 106 (*)    All other components within normal limits  CBC - Abnormal; Notable for the following components:   WBC 11.8 (*)    All other components within normal limits  HCG, SERUM, QUALITATIVE  HEPARIN  LEVEL (UNFRACTIONATED)  TROPONIN I (HIGH SENSITIVITY)  TROPONIN I (HIGH SENSITIVITY)    EKG: None  Radiology: CT Angio Chest PE W and/or Wo Contrast Result Date: 02/22/2024 CLINICAL DATA:  28 year old female with pleuritic pain. Family history of clotting disorder. EXAM: CT ANGIOGRAPHY CHEST WITH CONTRAST TECHNIQUE: Multidetector CT imaging of the chest was performed using the  standard protocol during bolus administration of intravenous contrast. Multiplanar CT image reconstructions and MIPs were obtained to evaluate the vascular anatomy. RADIATION DOSE REDUCTION: This exam was performed according to the departmental dose-optimization program which includes automated exposure control, adjustment of the mA and/or kV according to patient size and/or use of iterative reconstruction technique. CONTRAST:  75mL OMNIPAQUE  IOHEXOL  350 MG/ML SOLN COMPARISON:  Chest radiographs 0004 hours today and earlier. FINDINGS: Cardiovascular: Good contrast bolus timing in the pulmonary arterial tree. Mild respiratory motion. No central or lobar pulmonary artery filling defect. However, positive for segmental and subsegmental clot in the right mid lung (series 7, images 182 and 184), right middle lobe vessel. No other pulmonary artery filling defect is identified. Normal heart size. No pericardial effusion. Negative visible aorta. No evidence of calcified coronary artery atherosclerosis. Mediastinum/Nodes: Negative. No mediastinal mass or lymphadenopathy. Lungs/Pleura: No abnormal right middle lobe opacity but there is a small area of wedge-shaped and peripheral airspace opacity in the right lower lobe posterior basal segment, costophrenic angle, which is nonspecific. No pleural effusion. Major airways are patent. Lungs otherwise clear. Upper Abdomen: Possible of head a megaly and hepatic steatosis, incompletely visible. Negative visible early postcontrast appearance of the spleen, pancreas, left adrenal gland and left renal upper pole. Negative visible stomach. No pneumoperitoneum or free fluid in the visible upper abdomen. Musculoskeletal: No acute or suspicious osseous lesion. Lower thoracic vertebral endplate spurring. Review of the MIP images confirms the above findings. IMPRESSION: 1. Positive for a small segmental Acute Pulmonary Embolus the right middle lobe. No central or lobar PE. 2. Right middle  lobe is clear, and no pleural effusion is present. However, a small wedge-shaped and peripheral opacity in the right lower lobe is indeterminate for small infarct. Electronically Signed   By: VEAR Hurst M.D.   On: 02/22/2024 04:49   DG Chest 2 View Result Date: 02/22/2024 CLINICAL DATA:  Chest pain EXAM: CHEST - 2 VIEW COMPARISON:  None Available. FINDINGS: The heart size and mediastinal contours are within normal limits. Both lungs are clear. The visualized skeletal structures are unremarkable. IMPRESSION: No active cardiopulmonary disease. Electronically Signed   By: Dorethia Molt M.D.   On: 02/22/2024 00:10     Procedures   CRITICAL CARE Performed by: Olam CHRISTELLA Slocumb   Total critical care time: 45 minutes  Critical care time was exclusive of separately billable procedures and treating other patients.  Critical care was necessary to treat or prevent imminent or life-threatening deterioration.  Critical care was time spent personally by me on the following activities: development of treatment plan with patient and/or surrogate as well as nursing, discussions with consultants, evaluation of patient's response to treatment, examination of patient, obtaining history from patient or  surrogate, ordering and performing treatments and interventions, ordering and review of laboratory studies, ordering and review of radiographic studies, pulse oximetry and re-evaluation of patient's condition.   Medications Ordered in the ED  heparin  ADULT infusion 100 units/mL (25000 units/250mL) (1,700 Units/hr Intravenous New Bag/Given 02/22/24 0516)  fentaNYL  (SUBLIMAZE ) injection 50 mcg (50 mcg Intravenous Given 02/22/24 0252)  iohexol  (OMNIPAQUE ) 350 MG/ML injection 75 mL (75 mLs Intravenous Contrast Given 02/22/24 0335)  morphine  (PF) 4 MG/ML injection 4 mg (4 mg Intravenous Given 02/22/24 0353)  heparin  bolus via infusion 5,000 Units (5,000 Units Intravenous Bolus from Bag 02/22/24 0517)                                 Medical Decision Making Amount and/or Complexity of Data Reviewed Labs: ordered. Radiology: ordered and independent interpretation performed. ECG/medicine tests: ordered and independent interpretation performed.  Risk Prescription drug management. Decision regarding hospitalization.   28 year old female presenting to the ED with right upper chest pain.  Now seems to have some right posterior back pain as well.  Does have a pleuritic component, worse with deep breathing.  She is afebrile and nontoxic in appearance here.  She was tachycardic on arrival but seems to have resolved prior to my evaluation without intervention.  She does not have any reproducible pain on exam but some pain is elicited with deep breathing.  Her lungs are clear without any wheezes or rhonchi.  Labs are grossly reassuring--mild leukocytosis without electrolyte derangement.  Troponin negative x 2.  Patient's symptoms are suspicious for PE.  She does not really have any significant risk factors for this, does appear to have family history as mother has had recurrent DVTs and PEs.  Patient was previously on Eliquis  following her ablation but has been off of this for few months now.  Plan for CTA for further evaluation.  CTA is positive for right sided PE, also appears to have small pulmonary infarct.  No findings of heart strain.  Results discussed with patient.  She has not noticed any significant calf pain or swelling.  This seems to be unprovoked.  May need workup for potential clotting disorder given family history as well.  Will initiate IV heparin .  Spoke with hospitalist, Dr. Alfornia-- will admit for ongoing care.  Final diagnoses:  Single subsegmental pulmonary embolism without acute cor pulmonale [facility])  Pulmonary infarct [facility])    ED Discharge Orders     None          Jarold Olam HERO, PA-C 02/22/24 0623    Jarold Olam HERO, PA-C 02/22/24 [id number]    Haze Lonni PARAS, MD 02/23/24  [phone]

## 2024-02-22 NOTE — Progress Notes (Signed)
*  PRELIMINARY RESULTS* Echocardiogram 2D Echocardiogram has been performed with Definity .  Teresa Aida PARAS 02/22/2024, 5:10 PM

## 2024-02-22 NOTE — Progress Notes (Signed)
 PHARMACY - ANTICOAGULATION CONSULT NOTE  Pharmacy Consult for heparin  Indication: pulmonary embolus  No Known Allergies  Patient Measurements: Height: 5' 7 (170.2 cm) Weight: 129.3 kg (285 lb 0.9 oz) IBW/kg (Calculated) : 61.6 HEPARIN  DW (KG): 92.7  Vital Signs: Temp: 98.2 F (36.8 C) (08/30 0343) Temp Source: Oral (08/30 0343) BP: 166/103 (08/30 0343) Pulse Rate: 99 (08/30 0343)  Labs: Recent Labs    02/21/24 2344 02/22/24 0155  HGB 13.7  --   HCT 42.3  --   PLT 240  --   CREATININE 0.69  --   TROPONINIHS 4 4    Estimated Creatinine Clearance: 144 mL/min (by C-G formula based on SCr of 0.69 mg/dL).   Medical History: Past Medical History:  Diagnosis Date   Anxiety    xanax in past, pt states this is no longer a problem   Atrial fibrillation (HCC)    Headache    Hypertension    Nausea    Weakness     Assessment: 28yo female c/o CP and upper back pain, CT reveals PE >> to begin heparin .  Pt has a h/o Afib and was briefly on [facility] around ablation but had been d/c'd a few months ago.  Goal of Therapy:  Heparin  level 0.3-0.7 units/ml Monitor platelets by anticoagulation protocol: Yes   Plan:  Heparin  5000 units IV bolus followed by infusion at 1700 units/hr. Monitor heparin  levels and CBC.  Marvetta Dauphin, PharmD, BCPS  02/22/2024,5:03 AM

## 2024-02-22 NOTE — Progress Notes (Signed)
 PHARMACY - ANTICOAGULATION CONSULT NOTE  Pharmacy Consult for heparin  >> Eliquis  Indication: pulmonary embolus  No Known Allergies  Patient Measurements: Height: 5' 7 (170.2 cm) Weight: 129.3 kg (285 lb 0.9 oz) IBW/kg (Calculated) : 61.6 HEPARIN  DW (KG): 92.7  Vital Signs: Temp: 98.8 F (37.1 C) (08/30 1135) Temp Source: Oral (08/30 0855) BP: 134/98 (08/30 1135) Pulse Rate: 97 (08/30 1135)  Labs: Recent Labs    02/21/24 2344 02/22/24 0155 02/22/24 1007  HGB 13.7  --   --   HCT 42.3  --   --   PLT 240  --   --   HEPARINUNFRC  --   --  0.33  CREATININE 0.69  --   --   TROPONINIHS 4 4  --     Estimated Creatinine Clearance: 144 mL/min (by C-G formula based on SCr of 0.69 mg/dL).   Assessment: 28yo female c/o CP and upper back pain, CT reveals PE and started on IV heparin .  Pt has a h/o Afib and was briefly on [facility] around ablation but had been d/c'd a few months ago.  Pharmacy consulted to transition from IV heparin  to Eliquis .  Renal function and CBC stable; no bleeding reported.  Goal of Therapy:  Heparin  level 0.3-0.7 units/ml Monitor platelets by anticoagulation protocol: Yes   Plan:  Stop IV heparin  when Eliquis  administered Eliquis  10mg  PO BID x 7 days, then 5mg  PO BID Pharmacy will sign off and monitor peripherally.  Thank you for the consult!  Ugochukwu Chichester D. Lendell, PharmD, BCPS, BCCCP 02/22/2024, 2:56 PM

## 2024-02-22 NOTE — H&P (Signed)
 History and Physical    Patient: Eileen Buck FMW:[id number] DOB: July 03, 1995 DOA: 02/21/2024 DOS: the patient was seen and examined on 02/22/2024 PCP: Suanne Pfeiffer, NP  Patient coming from: Home  Chief Complaint:  Chief Complaint  Patient presents with   Chest Pain   HPI: [name] is a 28 y.o. female with medical history significant of atrial fibrillation s/p ablation, hypertension, anxiety, and morbid obesity presents with chest pain. She is accompanied by her fiancee.  The patient has been experiencing sharp chest pain and pain in the right shoulder blade. Initially, she thought the pain was due to indigestion or a result of turning the wrong way. However, the pain intensified yesterday, and she was unable to find relief with heat or other measures.  Was pain worsens when trying to take a deep breath.  She has no leg swelling, but does report calf discomfort when walking.  Denies having any fever, chills, cough, nausea, or vomiting  She has a history of atrial fibrillation and underwent an ablation in 2023. She was previously on Eliquis  for anticoagulation, which was stopped in the summer of last year following reevaluation with her EP cardiologist. She is currently monitoring her blood pressure with her cardiologist and has been tested for pheochromocytoma due to high blood pressure, with follow-up results reported to have been negative.  Her mother has a history of blood clots in her lungs and legs thought secondary to secondary to trauma. She denies any personal history of cancer, Will consult septic abuse, smoking, alcohol, or illicit drug use.  She works from home, often sitting for extended periods, though she tries to stay active with a treadmill and standing desk. She thought her calf discomfort when walking may have been attributed to being overweight.  In the ED patient was noted to be afebrile with pulse 86-111, blood pressures elevated up to 166/103, and all other  vital signs maintained.  Labs noted WBC 11.8 and high-sensitivity troponins negative x 2..  CT angiogram of the chest noted small subsegmental acute pulmonary embolus in the right middle lobe and right lower lobe peripheral opacity concerning for indeterminate for small infarct.  Patient had been started on a heparin  drip and given IV pain medication.  Review of Systems: As mentioned in the history of present illness. All other systems reviewed and are negative. Past Medical History:  Diagnosis Date   Anxiety    xanax in past, pt states this is no longer a problem   Atrial fibrillation (HCC)    Headache    Hypertension    Nausea    Weakness    Past Surgical History:  Procedure Laterality Date   ANKLE SURGERY  04/2005   APPENDECTOMY  09/16/11   ATRIAL FIBRILLATION ABLATION N/A 01/17/2022   Procedure: ATRIAL FIBRILLATION ABLATION;  Surgeon: Inocencio Soyla Lunger, MD;  Location: [facility];  Service: Cardiovascular;  Laterality: N/A;   LAPAROSCOPIC APPENDECTOMY  09/16/2011   Procedure: APPENDECTOMY LAPAROSCOPIC;  Surgeon: Krystal CHRISTELLA Spinner, MD;  Location: [facility];  Service: General;  Laterality: N/A;   NASAL SINUS SURGERY  08/2010   Social History:  reports that she has never smoked. She has never used smokeless tobacco. She reports that she does not drink alcohol and does not use drugs.  No Known Allergies  Family History  Problem Relation Age of Onset   Hypertension Mother    Heart disease Father    Cancer Paternal Grandmother        ovarian  Prior to Admission medications   Medication Sig Start Date End Date Taking? Authorizing Provider  amLODipine  (NORVASC ) 5 MG tablet Take 1 tablet (5 mg total) by mouth daily. 01/13/24 04/12/24 Yes Croitoru, Mihai, MD  carvedilol  (COREG ) 12.5 MG tablet Take 1 tablet (12.5 mg total) by mouth 2 (two) times daily with a meal. 01/22/24  Yes Croitoru, Mihai, MD  hydrochlorothiazide  (MICROZIDE ) 12.5 MG capsule Take 1 capsule (12.5 mg total) by mouth  daily. 01/13/24 04/12/24 Yes Croitoru, Mihai, MD  olmesartan  (BENICAR ) 20 MG tablet Take 1 tablet (20 mg total) by mouth daily. 01/13/24  Yes Croitoru, Jerel, MD    Physical Exam: Vitals:   02/22/24 0343 02/22/24 0500 02/22/24 0730 02/22/24 0737  BP: (!) 166/103 (!) 137/97 (!) 135/97   Pulse: 99 97 98   Resp: 20 14 18    Temp: 98.2 F (36.8 C)   98.1 F (36.7 C)  TempSrc: Oral   Oral  SpO2: 99% 100% 100%   Weight:  129.3 kg    Height:  5' 7 (1.702 m)     Constitutional: Morbidly obese female currently in no acute distress Eyes: PERRL, lids and conjunctivae normal ENMT: Mucous membranes are moist.  Normal dentition.  Neck: normal, supple, no masses, no thyromegaly Respiratory: clear to auscultation bilaterally, no wheezing, no crackles. Normal respiratory effort. No accessory muscle use.  Cardiovascular: Tachycardic. No extremity edema. 2+ pedal pulses.   Abdomen: no tenderness, no masses palpated.  Bowel sounds positive.  Musculoskeletal: no clubbing / cyanosis. No joint deformity upper and lower extremities. Good ROM, no contractures. Normal muscle tone.  Skin: no rashes, lesions, ulcers. No induration Neurologic: CN 2-12 grossly intact.  Strength 5/5 in all 4.  Psychiatric: Normal judgment and insight. Alert and oriented x 3. Normal mood.   Data Reviewed:  EKG revealed normal sinus rhythm at 94 bpm.  Reviewed labs, imaging, and pertinent records as documented.  Assessment and Plan:  Pulmonary embolism Acute.  Patient presented with complaints of chest pain.  Initially noted to be tachycardic, but O2 saturations maintained on room air.  High-sensitivity troponins negative x 2.  Chest x-ray did not show any acute abnormality.  However subsequent CTA of the chest significant forsmall subsegmental acute pulmonary embolus in the right middle lobe and right lower lobe peripheral opacity concerning for indeterminate for small infarct. - Admit to a telemetry bed - Incentive  spirometry - Transition to Eliquis  - Check Doppler ultrasound of the lower extremities.  Strict bedrest for at least 24 hours unless Doppler negative - Check echocardiogram - Hydrocodone /morphine  IV as needed for moderate to severe pain respectively - Follow-up telemetry - Advised patient of the possible need of further workup with hypercoagulable panel and/or hematology referral in the outpatient setting.  Leukocytosis Acute.  WBC elevated at 11.8.  Suspect reactive to above. - Recheck CBC tomorrow morning  Essential hypertension Blood pressures initially elevated up to 166/103. - Continue amlodipine , Coreg , hydrochlorothiazide , and pharmacy substitution of irbesartan  - Hydralazine  IV as needed - Continue outpatient follow-up with cardiology in regards to management of blood pressure  Paroxysmal atrial fibrillation S/p ablation Patient appears to be in a sinus rhythm at this time and is status post ablation back in 2023 with Dr. Inocencio.  Morbid obesity BMI 44.65 kg/m Recommend outpatient follow-up with primary in regards to weight loss-  DVT prophylaxis: Transitioning to Eliquis  Advance Care Planning:   Code Status: Full Code   Consults: None Family Communication: Patient's fianc updated at bedside  Severity of  Illness: The appropriate patient status for this patient is OBSERVATION. Observation status is judged to be reasonable and necessary in order to provide the required intensity of service to ensure the patient's safety. The patient's presenting symptoms, physical exam findings, and initial radiographic and laboratory data in the context of their medical condition is felt to place them at decreased risk for further clinical deterioration. Furthermore, it is anticipated that the patient will be medically stable for discharge from the hospital within 2 midnights of admission.   Author: Maximino DELENA Sharps, MD 02/22/2024 8:20 AM  For on call review [email or web address].

## 2024-02-23 ENCOUNTER — Observation Stay (HOSPITAL_BASED_OUTPATIENT_CLINIC_OR_DEPARTMENT_OTHER)

## 2024-02-23 DIAGNOSIS — I2699 Other pulmonary embolism without acute cor pulmonale: Secondary | ICD-10-CM | POA: Diagnosis not present

## 2024-02-23 DIAGNOSIS — Z86711 Personal history of pulmonary embolism: Secondary | ICD-10-CM | POA: Diagnosis not present

## 2024-02-23 LAB — BASIC METABOLIC PANEL WITH GFR
Anion gap: 10 (ref 5–15)
BUN: 13 mg/dL (ref 6–20)
CO2: 22 mmol/L (ref 22–32)
Calcium: 8.9 mg/dL (ref 8.9–10.3)
Chloride: 102 mmol/L (ref 98–111)
Creatinine, Ser: 0.75 mg/dL (ref 0.44–1.00)
GFR, Estimated: >60 mL/min (ref >60)
Glucose, Bld: 95 mg/dL (ref 70–99)
Potassium: 3.6 mmol/L (ref 3.5–5.1)
Sodium: 134 mmol/L — ABNORMAL LOW (ref 135–145)

## 2024-02-23 LAB — CBC
HCT: 40.4 % (ref 36.0–46.0)
Hemoglobin: 13.2 g/dL (ref 12.0–15.0)
MCH: 28.4 pg (ref 26.0–34.0)
MCHC: 32.7 g/dL (ref 30.0–36.0)
MCV: 87.1 fL (ref 80.0–100.0)
Platelets: 211 K/uL (ref 150–400)
RBC: 4.64 MIL/uL (ref 3.87–5.11)
RDW: 14.3 % (ref 11.5–15.5)
WBC: 10.6 K/uL — ABNORMAL HIGH (ref 4.0–10.5)
nRBC: 0 % (ref 0.0–0.2)

## 2024-02-23 MED ORDER — ACETAMINOPHEN 325 MG PO TABS: 650.0000 mg | ORAL_TABLET | Freq: Four times a day (QID) | ORAL | Status: DC | PRN

## 2024-02-23 MED ORDER — APIXABAN 5 MG PO TABS
ORAL_TABLET | ORAL | 0 refills | Status: DC
  Filled 2024-02-25: qty 80, 35d supply, fill #0
  Filled 2024-02-25: qty 70, 30d supply, fill #0

## 2024-02-23 MED ORDER — HYDROCODONE-ACETAMINOPHEN 5-325 MG PO TABS
1.0000 | ORAL_TABLET | Freq: Four times a day (QID) | ORAL | 0 refills | Status: DC | PRN
Start: 2024-02-23 — End: 2024-05-06

## 2024-02-23 NOTE — Plan of Care (Signed)

## 2024-02-23 NOTE — Progress Notes (Signed)
 VASCULAR LAB    Bilateral lower extremity venous duplex has been performed.  See CV proc for preliminary results.   Aalyssa Elderkin, RVT 02/23/2024, 2:18 PM

## 2024-02-23 NOTE — Progress Notes (Signed)
 D/c after US 

## 2024-02-23 NOTE — Care Management (Signed)
 Provided patient with 30 day and copay reduction cards for Eliquis .  She states she thinks may have used the 30 day one before and understands that it will not work if she has used one already. She is asking what copay will be and I have sent message to Rx team for benefit check She states that is was $5-10 when she was on it before but does not remember if it was the same insurance

## 2024-02-23 NOTE — Discharge Summary (Signed)
 Physician Discharge Summary   Patient: Eileen Buck MRN: [id number] DOB: 1996-04-07  Admit date:     02/21/2024  Discharge date: 02/23/24  Discharge Physician: Delon Herald   PCP: Suanne Pfeiffer, NP   Recommendations at discharge:   You have a small blood clot in your lung and will need to take blood thinners indefinitely; do not miss doses of Eliquis  You are being referred to hematology and should be contacted with an appointment Follow up with NP Keatts in 1-2 weeks You are being prescribed a limited number of narcotic pain pills; take as directed and do not drive or make important decisions while taking this medication   Discharge Diagnoses: Principal Problem:   Pulmonary embolism (HCC) Active Problems:   Leukocytosis   HTN (hypertension)   Paroxysmal atrial fibrillation (HCC)   Obesity, Class III, BMI 40-49.9 (morbid obesity)  Hospital Course: 28yo with h/o afib s/p ablation, HTN, anxiety, and morbid obesity who presented on 8/29 with chest pain.  CTA showed a small subsegmental acute PT in RML and RLL opacity concerning for small infarct.  Started on heparin  drip.  Assessment and Plan:  Acute pulmonary embolism Patient presented with R-sided chest pain which is still present but controlled with pain medication   Initially noted to be tachycardic, but O2 saturations maintained on room air High-sensitivity troponins negative x 2 CXR negative but CTA of the chest showed a small subsegmental acute pulmonary embolus in the right middle lobe and right lower lobe peripheral opacity concerning for indeterminate for small infarct Observed on telemetry  Incentive spirometry Heparin  -> Eliquis  (reports that she has taken Eliquis  before with her current insurance plan and can afford it) Pending Doppler ultrasound of the lower extremities - will dc once this is complete Echocardiogram unremarkable Will refer to hematology in the outpatient setting for consideration of further  evaluation She does report that her mother has had DVT/PE She appears to be sedentary, which is likely a contributing factor   Leukocytosis Reactive, improving   Essential hypertension Continue amlodipine , Coreg , hydrochlorothiazide , and irbesartan  Continue outpatient follow-up with cardiology in regards to management of blood pressure   Paroxysmal atrial fibrillation S/p ablation in 2023 with Dr. Inocencio Patient appears to be in a sinus rhythm   Morbid/class 3 obesity Body mass index is 44.65 kg/m.SABRA  Weight loss should be encouraged Outpatient PCP/bariatric medicine f/u encouraged Significantly low or high BMI is associated with higher medical risk including morbidity and mortality      Consultants: None  Procedures: Echocardiogram 8/30 DVT US  8/31 (pending)  Antibiotics: None     Pain control - [place]  Controlled Substance Reporting System database was reviewed. and patient was instructed, not to drive, operate heavy machinery, perform activities at heights, swimming or participation in water activities or provide baby-sitting services while on Pain, Sleep and Anxiety Medications; until their outpatient Physician has advised to do so again. Also recommended to not to take more than prescribed Pain, Sleep and Anxiety Medications.   Disposition: Home Diet recommendation:  Regular diet DISCHARGE MEDICATION: Allergies as of 02/23/2024   No Known Allergies      Medication List     TAKE these medications    acetaminophen  325 MG tablet Commonly known as: TYLENOL  Take 2 tablets (650 mg total) by mouth every 6 (six) hours as needed for mild pain (pain score 1-3) or fever (or Fever >/= 101).   amLODipine  5 MG tablet Commonly known as: NORVASC  Take 1 tablet (5 mg total) by  mouth daily.   apixaban  5 MG Tabs tablet Commonly known as: ELIQUIS  Take 2 tablets (10 mg total) by mouth 2 (two) times daily for 5 days, THEN 1 tablet (5 mg total) 2 (two) times  daily. Start taking on: February 23, 2024   carvedilol  12.5 MG tablet Commonly known as: COREG  Take 1 tablet (12.5 mg total) by mouth 2 (two) times daily with a meal.   hydrochlorothiazide  12.5 MG capsule Commonly known as: MICROZIDE  Take 1 capsule (12.5 mg total) by mouth daily.   HYDROcodone -acetaminophen  5-325 MG tablet Commonly known as: NORCO/VICODIN Take 1 tablet by mouth every 6 (six) hours as needed for moderate pain (pain score 4-6).   olmesartan  20 MG tablet Commonly known as: BENICAR  Take 1 tablet (20 mg total) by mouth daily.        Discharge Exam:   Subjective: Breathing is fine, continuing to have R-sided CP that radiates to shoulder blade.  Wants DVT US  prior to dc.   Objective: Vitals:   02/23/24 0732 02/23/24 1152  BP: (!) 141/86 124/72  Pulse: 94 (!) 101  Resp:    Temp: 98.2 F (36.8 C) 99.1 F (37.3 C)  SpO2: 97% 99%   No intake or output data in the 24 hours ending 02/23/24 1327 Filed Weights   02/22/24 0500  Weight: 129.3 kg    Exam:  General:  Appears calm and comfortable and is in NAD, on RA, appears sedentary Eyes:  normal lids, iris ENT:  grossly normal hearing, lips & tongue, mmm Cardiovascular:  RRR, no m/r/g. No LE edema.  Respiratory:   CTA bilaterally with no wheezes/rales/rhonchi.  Normal respiratory effort. Abdomen:  soft, NT, ND Skin:  no rash or induration seen on limited exam Musculoskeletal:  grossly normal tone BUE/BLE, good ROM, no bony abnormality Psychiatric: blunted mood and affect, speech fluent and appropriate, AOx3 Neurologic:  CN 2-12 grossly intact, moves all extremities in coordinated fashion  Data Reviewed: I have reviewed the patient's lab results since admission.  Pertinent labs for today include:   Unremarkable BMP WBC 10.6, improving    Condition at discharge: stable  The results of significant diagnostics from this hospitalization (including imaging, microbiology, ancillary and laboratory) are  listed below for reference.   Imaging Studies: ECHOCARDIOGRAM COMPLETE Result Date: 02/22/2024    ECHOCARDIOGRAM REPORT   Patient Name:   Eileen Buck Date of Exam: 02/22/2024 Medical Rec #:  [id number]        Height:       67.0 in Accession #:    [id number]       Weight:       285.1 lb Date of Birth:  1995-09-22         BSA:          2.351 m Patient Age:    28 years         BP:           134/98 mmHg Patient Gender: F                HR:           97 bpm. Exam Location:  Inpatient Procedure: 2D Echo, Cardiac Doppler, Color Doppler and Intracardiac            Opacification Agent (Both Spectral and Color Flow Doppler were            utilized during procedure). Indications:    Pulmonary Embolus I26.09  History:  Patient has prior history of Echocardiogram examinations, most                 recent 01/24/2024. Arrythmias:Atrial Fibrillation; Risk                 Factors:Hypertension. Morbid Obesity.  Sonographer:    Aida Pizza RCS Referring Phys: [id number] RONDELL A SMITH  Sonographer Comments: Suboptimal subcostal window. IMPRESSIONS  1. Left ventricular ejection fraction, by estimation, is 55 to 60%. The left ventricle has normal function. The left ventricle has no regional wall motion abnormalities. Left ventricular diastolic parameters are indeterminate.  2. Right ventricular systolic function is normal. The right ventricular size is normal.  3. The mitral valve is normal in structure. No evidence of mitral valve regurgitation. No evidence of mitral stenosis.  4. The aortic valve was not well visualized. Aortic valve regurgitation is not visualized. No aortic stenosis is present. Comparison(s): No significant change from prior study. Prior images reviewed side by side. FINDINGS  Left Ventricle: Left ventricular ejection fraction, by estimation, is 55 to 60%. The left ventricle has normal function. The left ventricle has no regional wall motion abnormalities. Definity  contrast agent was given IV to delineate  the left ventricular  endocardial borders. The left ventricular internal cavity size was normal in size. There is no left ventricular hypertrophy. Left ventricular diastolic parameters are indeterminate. Right Ventricle: The right ventricular size is normal. No increase in right ventricular wall thickness. Right ventricular systolic function is normal. Left Atrium: Left atrial size was normal in size. Right Atrium: Right atrial size was normal in size. Pericardium: There is no evidence of pericardial effusion. Mitral Valve: The mitral valve is normal in structure. No evidence of mitral valve regurgitation. No evidence of mitral valve stenosis. Tricuspid Valve: The tricuspid valve is normal in structure. Tricuspid valve regurgitation is not demonstrated. No evidence of tricuspid stenosis. Aortic Valve: The aortic valve was not well visualized. Aortic valve regurgitation is not visualized. No aortic stenosis is present. Pulmonic Valve: The pulmonic valve was not well visualized. Pulmonic valve regurgitation is not visualized. Aorta: The aortic root is normal in size and structure. IAS/Shunts: The interatrial septum was not well visualized.  LEFT VENTRICLE PLAX 2D LVIDd:         4.24 cm LVIDs:         2.90 cm LV PW:         1.00 cm LV IVS:        1.07 cm LVOT diam:     2.01 cm LV SV:         50 LV SV Index:   21 LVOT Area:     3.17 cm  RIGHT VENTRICLE RV S prime:     14.00 cm/s TAPSE (M-mode): 2.0 cm LEFT ATRIUM             Index        RIGHT ATRIUM           Index LA diam:        3.64 cm 1.55 cm/m   RA Area:     17.60 cm LA Vol (A2C):   46.7 ml 19.86 ml/m  RA Volume:   46.70 ml  19.86 ml/m LA Vol (A4C):   47.2 ml 20.08 ml/m LA Biplane Vol: 51.6 ml 21.95 ml/m  AORTIC VALVE LVOT Vmax:   85.70 cm/s LVOT Vmean:  62.900 cm/s LVOT VTI:    0.158 m  AORTA Ao Root diam: 2.74 cm MITRAL VALVE MV Area (  PHT): 5.38 cm    SHUNTS MV Decel Time: 141 msec    Systemic VTI:  0.16 m MV E velocity: 94.80 cm/s  Systemic Diam: 2.01  cm Stanly Leavens MD Electronically signed by Stanly Leavens MD Signature Date/Time: 02/22/2024/5:42:09 PM    Final    CT Angio Chest PE W and/or Wo Contrast Addendum Date: 02/22/2024 ADDENDUM REPORT: 02/22/2024 05:21 ADDENDUM: Study discussed by telephone with PA LISA SANDERS on 02/22/2024 at 0455 hours. Electronically Signed   By: VEAR Hurst M.D.   On: 02/22/2024 05:21   Result Date: 02/22/2024 CLINICAL DATA:  28 year old female with pleuritic pain. Family history of clotting disorder. EXAM: CT ANGIOGRAPHY CHEST WITH CONTRAST TECHNIQUE: Multidetector CT imaging of the chest was performed using the standard protocol during bolus administration of intravenous contrast. Multiplanar CT image reconstructions and MIPs were obtained to evaluate the vascular anatomy. RADIATION DOSE REDUCTION: This exam was performed according to the departmental dose-optimization program which includes automated exposure control, adjustment of the mA and/or kV according to patient size and/or use of iterative reconstruction technique. CONTRAST:  75mL OMNIPAQUE  IOHEXOL  350 MG/ML SOLN COMPARISON:  Chest radiographs 0004 hours today and earlier. FINDINGS: Cardiovascular: Good contrast bolus timing in the pulmonary arterial tree. Mild respiratory motion. No central or lobar pulmonary artery filling defect. However, positive for segmental and subsegmental clot in the right mid lung (series 7, images 182 and 184), right middle lobe vessel. No other pulmonary artery filling defect is identified. Normal heart size. No pericardial effusion. Negative visible aorta. No evidence of calcified coronary artery atherosclerosis. Mediastinum/Nodes: Negative. No mediastinal mass or lymphadenopathy. Lungs/Pleura: No abnormal right middle lobe opacity but there is a small area of wedge-shaped and peripheral airspace opacity in the right lower lobe posterior basal segment, costophrenic angle, which is nonspecific. No pleural effusion. Major airways  are patent. Lungs otherwise clear. Upper Abdomen: Possible of head a megaly and hepatic steatosis, incompletely visible. Negative visible early postcontrast appearance of the spleen, pancreas, left adrenal gland and left renal upper pole. Negative visible stomach. No pneumoperitoneum or free fluid in the visible upper abdomen. Musculoskeletal: No acute or suspicious osseous lesion. Lower thoracic vertebral endplate spurring. Review of the MIP images confirms the above findings. IMPRESSION: 1. Positive for a small segmental Acute Pulmonary Embolus the right middle lobe. No central or lobar PE. 2. Right middle lobe is clear, and no pleural effusion is present. However, a small wedge-shaped and peripheral opacity in the right lower lobe is indeterminate for small infarct. Electronically Signed: By: VEAR Hurst M.D. On: 02/22/2024 04:49   DG Chest 2 View Result Date: 02/22/2024 CLINICAL DATA:  Chest pain EXAM: CHEST - 2 VIEW COMPARISON:  None Available. FINDINGS: The heart size and mediastinal contours are within normal limits. Both lungs are clear. The visualized skeletal structures are unremarkable. IMPRESSION: No active cardiopulmonary disease. Electronically Signed   By: Dorethia Molt M.D.   On: 02/22/2024 00:10   ECHOCARDIOGRAM COMPLETE Result Date: 01/24/2024    ECHOCARDIOGRAM REPORT   Patient Name:   Diedra J Preuss Date of Exam: 01/24/2024 Medical Rec #:  [id number]        Height:       67.0 in Accession #:    [id number]       Weight:       285.4 lb Date of Birth:  August 10, 1995         BSA:          2.352 m Patient Age:  28 years         BP:           120/78 mmHg Patient Gender: F                HR:           112 bpm. Exam Location:  [address] Procedure: 2D Echo, Color Doppler, Cardiac Doppler and Intracardiac            Opacification Agent (Both Spectral and Color Flow Doppler were            utilized during procedure). Indications:    R06.02 SOB  History:        Patient has prior history of Echocardiogram  examinations, most                 recent 06/07/2021. Atrial Fibrillation Ablation;                 Arrythmias:Atrial Fibrillation.  Sonographer:    Augustin Seals RDCS Referring Phys: [phone] MIHAI CROITORU IMPRESSIONS  1. Left ventricular ejection fraction, by estimation, is 55 to 60%. The left ventricle has normal function. The left ventricle has no regional wall motion abnormalities. There is mild concentric left ventricular hypertrophy. Indeterminate diastolic filling due to E-A fusion.  2. Right ventricular systolic function is normal. The right ventricular size is normal.  3. The mitral valve is normal in structure. Trivial mitral valve regurgitation. No evidence of mitral stenosis.  4. The aortic valve is normal in structure. Aortic valve regurgitation is not visualized. No aortic stenosis is present.  5. The inferior vena cava is normal in size with greater than 50% respiratory variability, suggesting right atrial pressure of 3 mmHg. FINDINGS  Left Ventricle: Left ventricular ejection fraction, by estimation, is 55 to 60%. The left ventricle has normal function. The left ventricle has no regional wall motion abnormalities. Definity  contrast agent was given IV to delineate the left ventricular  endocardial borders. The left ventricular internal cavity size was normal in size. There is mild concentric left ventricular hypertrophy. Indeterminate diastolic filling due to E-A fusion. Right Ventricle: The right ventricular size is normal. No increase in right ventricular wall thickness. Right ventricular systolic function is normal. Left Atrium: Left atrial size was normal in size. Right Atrium: Right atrial size was normal in size. Pericardium: There is no evidence of pericardial effusion. Mitral Valve: The mitral valve is normal in structure. Trivial mitral valve regurgitation. No evidence of mitral valve stenosis. Tricuspid Valve: The tricuspid valve is normal in structure. Tricuspid valve regurgitation is not  demonstrated. No evidence of tricuspid stenosis. Aortic Valve: The aortic valve is normal in structure. Aortic valve regurgitation is not visualized. No aortic stenosis is present. Pulmonic Valve: The pulmonic valve was normal in structure. Pulmonic valve regurgitation is not visualized. No evidence of pulmonic stenosis. Aorta: The aortic root is normal in size and structure. Venous: The inferior vena cava is normal in size with greater than 50% respiratory variability, suggesting right atrial pressure of 3 mmHg. IAS/Shunts: No atrial level shunt detected by color flow Doppler.  LEFT VENTRICLE PLAX 2D LVIDd:         3.70 cm LVIDs:         2.70 cm LV PW:         1.20 cm LV IVS:        1.20 cm LVOT diam:     2.20 cm LV SV:         57  LV SV Index:   24 LVOT Area:     3.80 cm  RIGHT VENTRICLE RV Basal diam:  3.20 cm RV Mid diam:    3.40 cm RV S prime:     10.70 cm/s TAPSE (M-mode): 1.7 cm LEFT ATRIUM             Index       RIGHT ATRIUM           Index LA diam:        3.30 cm 1.40 cm/m  RA Area:     15.80 cm LA Vol (A2C):   20.2 ml 8.59 ml/m  RA Volume:   38.80 ml  16.50 ml/m LA Vol (A4C):   15.0 ml 6.38 ml/m LA Biplane Vol: 16.8 ml 7.14 ml/m  AORTIC VALVE LVOT Vmax:   97.00 cm/s LVOT Vmean:  65.300 cm/s LVOT VTI:    0.151 m  AORTA Ao Root diam: 2.70 cm Ao Asc diam:  2.90 cm  SHUNTS Systemic VTI:  0.15 m Systemic Diam: 2.20 cm Toribio Fuel MD Electronically signed by Toribio Fuel MD Signature Date/Time: 01/24/2024/4:38:02 PM    Final     Microbiology: Results for orders placed or performed during the hospital encounter of 11/25/18  Novel Coronavirus, NAA (hospital order; send-out to ref lab)     Status: None   Collection Time: 11/25/18  8:33 AM   Specimen: Nasopharyngeal Swab; Respiratory  Result Value Ref Range Status   SARS-CoV-2, NAA NOT DETECTED NOT DETECTED Final    Comment: (NOTE) This test was developed and its performance characteristics determined by [employer]. This test has  not been FDA cleared or approved. This test has been authorized by FDA under an Emergency Use Authorization (EUA). This test is only authorized for the duration of time the declaration that circumstances exist justifying the authorization of the emergency use of in vitro diagnostic tests for detection of SARS-CoV-2 virus and/or diagnosis of COVID-19 infection under section 564(b)(1) of the Act, 21 U.S.C. 639aaa-6(a)(8), unless the authorization is terminated or revoked sooner. When diagnostic testing is negative, the possibility of a false negative result should be considered in the context of a patient's recent exposures and the presence of clinical signs and symptoms consistent with COVID-19. An individual without symptoms of COVID-19 and who is not shedding SARS-CoV-2 virus would expect to have a negative (not detected) result in this assay. Performed  At: [facility] [address] [place], [place] [id number] Jennette Shorter MD Ey:[id number]    Coronavirus Source NASOPHARYNGEAL  Final    Comment: Performed at [facility], [address] [place], [place] [address]    Labs: CBC: Recent Labs  Lab 02/21/24 2344 02/23/24 0537  WBC 11.8* 10.6*  HGB 13.7 13.2  HCT 42.3 40.4  MCV 87.8 87.1  PLT 240 211   Basic Metabolic Panel: Recent Labs  Lab 02/21/24 2344 02/23/24 0537  NA 139 134*  K 3.9 3.6  CL 105 102  CO2 27 22  GLUCOSE 106* 95  BUN 14 13  CREATININE 0.69 0.75  CALCIUM 9.4 8.9   Liver Function Tests: No results for input(s): AST, ALT, ALKPHOS, BILITOT, PROT, ALBUMIN in the last 168 hours. CBG: No results for input(s): GLUCAP in the last 168 hours.  Discharge time spent: greater than 30 minutes.  Signed: Delon Herald, MD [facility] 02/23/2024

## 2024-02-23 NOTE — Plan of Care (Signed)

## 2024-02-23 NOTE — Progress Notes (Signed)
 Reviewed AVS with patient. Patient verbalized understanding of all teaching. All questions answered. PIV removed. Tele removed.

## 2024-02-23 NOTE — Hospital Course (Signed)
 28yo with h/o afib s/p ablation, HTN, anxiety, and morbid obesity who presented on 8/29 with chest pain.  CTA showed a small subsegmental acute PT in RML and RLL opacity concerning for small infarct.  Started on heparin  drip.

## 2024-02-25 ENCOUNTER — Other Ambulatory Visit (HOSPITAL_COMMUNITY): Payer: Self-pay

## 2024-02-25 ENCOUNTER — Ambulatory Visit: Attending: Cardiovascular Disease | Admitting: Cardiovascular Disease

## 2024-02-25 ENCOUNTER — Telehealth (HOSPITAL_COMMUNITY): Payer: Self-pay | Admitting: Pharmacy Technician

## 2024-02-25 ENCOUNTER — Encounter: Payer: Self-pay | Admitting: Cardiovascular Disease

## 2024-02-25 VITALS — BP 116/80 | HR 100 | Ht 67.0 in | Wt 284.0 lb

## 2024-02-25 DIAGNOSIS — D6869 Other thrombophilia: Secondary | ICD-10-CM

## 2024-02-25 DIAGNOSIS — I2699 Other pulmonary embolism without acute cor pulmonale: Secondary | ICD-10-CM

## 2024-02-25 DIAGNOSIS — Z9889 Other specified postprocedural states: Secondary | ICD-10-CM | POA: Diagnosis not present

## 2024-02-25 DIAGNOSIS — I4891 Unspecified atrial fibrillation: Secondary | ICD-10-CM

## 2024-02-25 NOTE — Telephone Encounter (Signed)
 Patient Product/process development scientist completed.    The patient is insured through [facility]. Patient has [employer], may use a copay card, and/or apply for patient assistance if available.    Ran test claim for Eliquis  5 mg and the current 30 day co-pay is $149.89.   This test claim was processed through [facility]- copay amounts may vary at other pharmacies due to pharmacy/plan contracts, or as the patient moves through the different stages of their insurance plan.     Reyes Sharps, CPHT Pharmacy Technician III Certified Patient Advocate [facility] Patient Advocate Team Direct Number: [phone]  Fax: [phone]

## 2024-02-25 NOTE — Patient Instructions (Addendum)
 Medication Instructions:  Your physician recommends that you continue on your current medications as directed. Please refer to the Current Medication list given to you today.  *If you need a refill on your cardiac medications before your next appointment, please call your pharmacy*  Lab Work: Your physician recommends that you have labs drawn today: Hypercoag panel  If you have labs (blood work) drawn today and your tests are completely normal, you will receive your results only by: MyChart Message (if you have MyChart) OR A paper copy in the mail If you have any lab test that is abnormal or we need to change your treatment, we will call you to review the results.   Follow-Up: At [facility], you and your health needs are our priority.  As part of our continuing mission to provide you with exceptional heart care, our providers are all part of one team.  This team includes your primary Cardiologist (physician) and Advanced Practice Providers or APPs (Physician Assistants and Nurse Practitioners) who all work together to provide you with the care you need, when you need it.  Your next appointment:   12 month(s)  Provider:   Jerel Balding, MD      Other Instructions We will schedule a nurse visit for blood pressure cuff calibration.

## 2024-02-26 NOTE — Progress Notes (Signed)
 Cardiology Office Note:  .   Date:  02/26/2024  ID:  Eileen Buck, DOB [birth year], MRN [id number] PCP: Suanne Pfeiffer, NP  [facility] Providers Cardiologist:  Jerel Balding, MD Electrophysiologist:  Will Gladis Norton, MD    History of Present Illness: .   Eileen Buck is a 28 y.o. female with acute pulmonary embolism, early onset of paroxysmal atrial fibrillation s/p radiofrequency ablation in 2023 without recurrence, severe hypertension without identifiable secondary cause, morbid obesity.  She has mild obstructive sleep apnea that only manifests when she sleeps on her back, not on CPAP.  She was briefly hospitalized just this weekend with acute right subsegmental pulmonary embolism involving the right middle and lower lobes with a small pulmonary infarction.  She did not have significant hypoxemia.  She was started on oral anticoagulants.  She was in sinus rhythm throughout her hospitalization.  She had a normal echocardiogram with normal right and left ventricular function.  No clear cause that could have precipitated her pulmonary embolism is identified.  She continues to have mild right-sided pleuritic pain but has not had shortness of breath, dizziness, syncope, palpitations or any bleeding issues.  Her blood pressure today is very well-controlled, as it was during her echocardiogram in early August and during most of her hospitalization last weekend (her blood pressure was elevated when she first came to the emergency room).  Her home blood pressure cuff consistently shows elevated blood pressure (especially diastolic) around 859/09-899.  This raises the issue of possible inaccuracy of her home equipment.  However, her heart rate is also frequently elevated at rest, usually in the high 80s and mid 90s even low 100s.  Workup for secondary cause for severe hypertension has been unrevealing.  Serum catecholamines were normal.  24-hour urine collection for metanephrines was  negative (VMA was not tested unfortunately).  Renin aldosterone ratio in the past was also normal.  She denies daytime hypersomnolence, headaches, focal neurological complaints, lower extremity edema, cough, hemoptysis, fever, chills, shortness of breath   Studies Reviewed: .         Risk Assessment/Calculations:    CHA2DS2-VASc Score =   2 (HTN, gender).   This indicates a  % annual risk of stroke. The patient's score is based upon:               Physical Exam:   VS:  BP 116/80 (BP Location: Left Arm, Patient Position: Sitting, Cuff Size: Large)   Pulse 100   Ht 5' 7 (1.702 m)   Wt 284 lb (128.8 kg)   SpO2 99%   BMI 44.48 kg/m    Wt Readings from Last 3 Encounters:  02/25/24 284 lb (128.8 kg)  02/22/24 285 lb 0.9 oz (129.3 kg)  01/13/24 285 lb 6.4 oz (129.5 kg)    GEN: Well nourished, well developed in no acute distress.  Looks a little flushed NECK: No JVD; No carotid bruits CARDIAC: Tachycardia, RRR, no murmurs, rubs, gallops RESPIRATORY:  Clear to auscultation without rales, wheezing or rhonchi  ABDOMEN: Soft, non-tender, non-distended EXTREMITIES:  No edema; No deformity   ASSESSMENT AND PLAN: .    Pulmonary embolism: No evidence of DVT on ultrasound.  No obvious cause other than obesity.  Will send off a hypercoagulable panel.  On Eliquis , getting ready to drop to the maintenance 5 mg twice daily dose. HTN: Well-controlled in the office on multiple medications.  Negative workup for secondary causes.  Continue olmesartan /hydrochlorothiazide /amlodipine /carvedilol  Sinus tachycardia: a persistent manifestation, preceding her  episode of pulmonary embolism. 24 h urine collection for metanephrines was normal.  VMA was unfortunately not tested.  No other manifestations to suggest pheochromocytoma.    AFib: No recurrence since successful ablation in July 2023.  Off anticoagulants since July 2024, but these have been restarted for pulmonary embolism. Obesity: As previously  discussed, weight loss would be highly beneficial, and she reports commitment to weight loss efforts.  She has been trying hard for well over 6 months.  Has had very limited success.  Interested in  GLP-1 agonist.  Will see if we can get insurance coverage for these.    Signed, Jerel Balding, MD

## 2024-03-24 ENCOUNTER — Ambulatory Visit: Admitting: Gastroenterology

## 2024-03-26 ENCOUNTER — Ambulatory Visit: Admitting: Pharmacist Clinician (PhC)/ Clinical Pharmacy Specialist

## 2024-03-27 ENCOUNTER — Encounter: Payer: Self-pay | Admitting: Cardiovascular Disease

## 2024-03-27 ENCOUNTER — Other Ambulatory Visit (HOSPITAL_COMMUNITY): Payer: Self-pay

## 2024-03-27 DIAGNOSIS — I4891 Unspecified atrial fibrillation: Secondary | ICD-10-CM

## 2024-03-27 MED ORDER — APIXABAN 5 MG PO TABS
5.0000 mg | ORAL_TABLET | Freq: Two times a day (BID) | ORAL | 5 refills | Status: AC
  Filled 2024-03-27: qty 60, 30d supply, fill #0
  Filled 2024-04-23: qty 60, 30d supply, fill #1
  Filled 2024-05-27: qty 60, 30d supply, fill #2
  Filled 2024-06-25: qty 60, 30d supply, fill #3

## 2024-04-01 ENCOUNTER — Other Ambulatory Visit: Payer: Self-pay

## 2024-04-01 ENCOUNTER — Other Ambulatory Visit (HOSPITAL_COMMUNITY): Payer: Self-pay

## 2024-04-01 MED ORDER — CIPROFLOXACIN-DEXAMETHASONE 0.3-0.1 % OT SUSP
4.0000 [drp] | Freq: Two times a day (BID) | OTIC | 0 refills | Status: DC
  Filled 2024-04-01: qty 7.5, 10d supply, fill #0

## 2024-04-03 ENCOUNTER — Telehealth: Payer: Self-pay | Admitting: Oncology

## 2024-04-28 ENCOUNTER — Telehealth: Payer: Self-pay | Admitting: Oncology

## 2024-04-28 ENCOUNTER — Encounter: Admitting: Oncology

## 2024-04-28 NOTE — Telephone Encounter (Signed)
 Called PT to give updated appt time and day and location being [facility] now.

## 2024-04-30 ENCOUNTER — Inpatient Hospital Stay: Admitting: Nurse Practitioner

## 2024-05-04 ENCOUNTER — Encounter: Payer: Self-pay | Admitting: Cardiovascular Disease

## 2024-05-04 ENCOUNTER — Emergency Department (HOSPITAL_COMMUNITY)
Admission: EM | Admit: 2024-05-04 | Discharge: 2024-05-05 | Disposition: A | Discharge status: Home / Self Care | Attending: Emergency Medicine | Admitting: Emergency Medicine

## 2024-05-04 ENCOUNTER — Other Ambulatory Visit: Payer: Self-pay

## 2024-05-04 ENCOUNTER — Encounter (HOSPITAL_COMMUNITY): Payer: Self-pay

## 2024-05-04 ENCOUNTER — Telehealth: Payer: Self-pay | Admitting: Oncology

## 2024-05-04 ENCOUNTER — Other Ambulatory Visit: Payer: Self-pay | Admitting: *Deleted

## 2024-05-04 ENCOUNTER — Other Ambulatory Visit: Payer: Self-pay | Admitting: Cardiovascular Disease

## 2024-05-04 DIAGNOSIS — Z79899 Other long term (current) drug therapy: Secondary | ICD-10-CM | POA: Diagnosis not present

## 2024-05-04 DIAGNOSIS — R0789 Other chest pain: Secondary | ICD-10-CM | POA: Diagnosis present

## 2024-05-04 DIAGNOSIS — R0600 Dyspnea, unspecified: Secondary | ICD-10-CM

## 2024-05-04 DIAGNOSIS — I10 Essential (primary) hypertension: Secondary | ICD-10-CM | POA: Diagnosis not present

## 2024-05-04 DIAGNOSIS — R079 Chest pain, unspecified: Secondary | ICD-10-CM

## 2024-05-04 DIAGNOSIS — Z7901 Long term (current) use of anticoagulants: Secondary | ICD-10-CM | POA: Insufficient documentation

## 2024-05-04 LAB — CBC WITH DIFFERENTIAL/PLATELET
Abs Immature Granulocytes: 0.03 K/uL (ref 0.00–0.07)
Basophils Absolute: 0.1 K/uL (ref 0.0–0.1)
Basophils Relative: 1 %
Eosinophils Absolute: 0.4 K/uL (ref 0.0–0.5)
Eosinophils Relative: 4 %
HCT: 41.4 % (ref 36.0–46.0)
Hemoglobin: 13.3 g/dL (ref 12.0–15.0)
Immature Granulocytes: 0 %
Lymphocytes Relative: 40 %
Lymphs Abs: 4.3 K/uL — ABNORMAL HIGH (ref 0.7–4.0)
MCH: 28.5 pg (ref 26.0–34.0)
MCHC: 32.1 g/dL (ref 30.0–36.0)
MCV: 88.7 fL (ref 80.0–100.0)
Monocytes Absolute: 0.7 K/uL (ref 0.1–1.0)
Monocytes Relative: 7 %
Neutro Abs: 5.3 K/uL (ref 1.7–7.7)
Neutrophils Relative %: 48 %
Platelets: 226 K/uL (ref 150–400)
RBC: 4.67 MIL/uL (ref 3.87–5.11)
RDW: 13.5 % (ref 11.5–15.5)
WBC: 10.9 K/uL — ABNORMAL HIGH (ref 4.0–10.5)
nRBC: 0 % (ref 0.0–0.2)

## 2024-05-04 LAB — BASIC METABOLIC PANEL WITH GFR
Anion gap: 17 — ABNORMAL HIGH (ref 5–15)
BUN: 10 mg/dL (ref 6–20)
CO2: 25 mmol/L (ref 22–32)
Calcium: 9.3 mg/dL (ref 8.9–10.3)
Chloride: 103 mmol/L (ref 98–111)
Creatinine, Ser: 0.7 mg/dL (ref 0.44–1.00)
GFR, Estimated: >60 mL/min (ref >60)
Glucose, Bld: 104 mg/dL — ABNORMAL HIGH (ref 70–99)
Potassium: 4.1 mmol/L (ref 3.5–5.1)
Sodium: 145 mmol/L (ref 135–145)

## 2024-05-04 LAB — HCG, SERUM, QUALITATIVE: Preg, Serum: NEGATIVE

## 2024-05-04 LAB — TROPONIN I (HIGH SENSITIVITY): Troponin I (High Sensitivity): 3 ng/L (ref <18)

## 2024-05-04 LAB — D-DIMER, QUANTITATIVE: D-Dimer, Quant: <0.27 ug{FEU}/mL (ref 0.00–0.50)

## 2024-05-04 NOTE — ED Triage Notes (Signed)
 Nurse First Note: Pt presents with CP and ShOB that started last night. Pt has a hx of PE 2 months previously and she states symptoms feel similar. Pt is on Eliquis  BID and has not missed any doses.

## 2024-05-04 NOTE — Progress Notes (Unsigned)
 Patient called in with chest pain and dyspnea reminiscent of previous PE. On apixaban  therapy. Will schedule STAT D-dimer draw, CT angio if the D-dimer is abnormal. Has Hematology f/u later this week.

## 2024-05-04 NOTE — ED Provider Triage Note (Signed)
 Emergency Medicine Provider Triage Evaluation Note  RAGENA FIOLA , a 28 y.o. female  was evaluated in triage.  Pt complains of chest pain and shortness of breath.  History of A-fib, PE diagnosed 2 months ago.  On DOAC.  No missed doses.  Chest pain started yesterday.  Feels like when she had her initial PE.  She discussed with her cardiologist who had ordered a D-dimer however when patient had this done she was told it was not statin the results would not be back sooner. She came here for labs  Review of Systems  Positive: Cp, SOB Negative:   Physical Exam  BP 126/85   Pulse 91   Temp 97.7 F (36.5 C)   Resp 20   SpO2 100%  Gen:   Awake, no distress   Resp:  Normal effort  MSK:   Moves extremities without difficulty  Other:    Medical Decision Making  Medically screening exam initiated at 8:19 PM.  Appropriate orders placed.  Fallynn J Stonehouse was informed that the remainder of the evaluation will be completed by another provider, this initial triage assessment does not replace that evaluation, and the importance of remaining in the ED until their evaluation is complete.  CP   Smitty Ackerley A, PA-C 05/04/24 2020

## 2024-05-05 ENCOUNTER — Ambulatory Visit: Payer: Self-pay | Admitting: Cardiovascular Disease

## 2024-05-05 LAB — TROPONIN I (HIGH SENSITIVITY): Troponin I (High Sensitivity): 3 ng/L (ref <18)

## 2024-05-05 LAB — D-DIMER, QUANTITATIVE: D-DIMER: <0.2 mg{FEU}/L (ref 0.00–0.49)

## 2024-05-05 MED ORDER — DIAZEPAM 5 MG/ML IJ SOLN
5.0000 mg | Freq: Once | INTRAMUSCULAR | Status: AC
  Administered 2024-05-05: 5 mg via INTRAVENOUS
  Filled 2024-05-05: qty 2

## 2024-05-05 NOTE — Discharge Instructions (Signed)
 Follow up with your hematologist as scheduled. Return to the ER for worsening or concerning symptoms.

## 2024-05-05 NOTE — ED Provider Notes (Signed)
 [place] EMERGENCY DEPARTMENT AT [facility] Provider Note   CSN: [id number] Arrival date & time: 05/04/24  2007     Patient presents with: Chest Pain   Eileen Buck is a 28 y.o. female.   28yo female with history of HTN, a fib s/p ablation, recently diagnosed with PE, on Eliquis . Developed CP on Monday, sharp in nature, on the left side, radiates to back, similar to the pain she experienced with her PE (on right side). Compliant with her Eliquis , no missed doses. Scheduled to see hematology Wednesday. No associated nausea, vomiting. No SHOB, abdominal pain, lower extremity edema.        Prior to Admission medications   Medication Sig Start Date End Date Taking? Authorizing Provider  acetaminophen  (TYLENOL ) 325 MG tablet Take 2 tablets (650 mg total) by mouth every 6 (six) hours as needed for mild pain (pain score 1-3) or fever (or Fever >/= 101). Patient not taking: Reported on 02/25/2024 02/23/24   Barbarann Nest, MD  amLODipine  (NORVASC ) 5 MG tablet Take 1 tablet (5 mg total) by mouth daily. 01/13/24 07/11/24  Croitoru, Mihai, MD  apixaban  (ELIQUIS ) 5 MG TABS tablet Take 1 tablet (5 mg total) by mouth 2 (two) times daily. 03/27/24   Croitoru, Mihai, MD  carvedilol  (COREG ) 12.5 MG tablet Take 1 tablet (12.5 mg total) by mouth 2 (two) times daily with a meal. 01/22/24   Croitoru, Mihai, MD  ciprofloxacin -dexamethasone  (CIPRODEX) OTIC suspension Place 4 drops into both ears 2 (two) times daily for 10 days. 04/01/24     hydrochlorothiazide  (MICROZIDE ) 12.5 MG capsule Take 1 capsule (12.5 mg total) by mouth daily. 01/13/24 07/11/24  Croitoru, Mihai, MD  HYDROcodone -acetaminophen  (NORCO/VICODIN) 5-325 MG tablet Take 1 tablet by mouth every 6 (six) hours as needed for moderate pain (pain score 4-6). 02/23/24   Barbarann Nest, MD  olmesartan  (BENICAR ) 20 MG tablet Take 1 tablet (20 mg total) by mouth daily. 01/13/24   Croitoru, Jerel, MD    Allergies: Patient has no known  allergies.    Review of Systems Negative except as per HPI Updated Vital Signs BP 135/82   Pulse 91   Temp 97.7 F (36.5 C)   Resp 18   SpO2 100%   Physical Exam Vitals and nursing note reviewed.  Constitutional:      General: She is not in acute distress.    Appearance: She is well-developed. She is obese. She is not diaphoretic.  HENT:     Head: Normocephalic and atraumatic.  Cardiovascular:     Rate and Rhythm: Normal rate and regular rhythm.     Heart sounds: Normal heart sounds.  Pulmonary:     Effort: Pulmonary effort is normal.     Breath sounds: Normal breath sounds.  Chest:     Chest wall: Tenderness present.    Abdominal:     Palpations: Abdomen is soft.     Tenderness: There is no abdominal tenderness.  Musculoskeletal:     Cervical back: Neck supple.     Right lower leg: No tenderness. No edema.     Left lower leg: No tenderness. No edema.  Skin:    General: Skin is warm and dry.     Findings: No erythema or rash.  Neurological:     Mental Status: She is alert and oriented to person, place, and time.  Psychiatric:        Behavior: Behavior normal.     (all labs ordered are listed, but only abnormal  results are displayed) Labs Reviewed  CBC WITH DIFFERENTIAL/PLATELET - Abnormal; Notable for the following components:      Result Value   WBC 10.9 (*)    Lymphs Abs 4.3 (*)    All other components within normal limits  BASIC METABOLIC PANEL WITH GFR - Abnormal; Notable for the following components:   Glucose, Bld 104 (*)    Anion gap 17 (*)    All other components within normal limits  D-DIMER, QUANTITATIVE  HCG, SERUM, QUALITATIVE  TROPONIN I (HIGH SENSITIVITY)  TROPONIN I (HIGH SENSITIVITY)    EKG: EKG Interpretation Date/Time:  Monday May 04 2024 20:31:23 EST Ventricular Rate:  94 PR Interval:  156 QRS Duration:  96 QT Interval:  358 QTC Calculation: 447 R Axis:   78  Text Interpretation: Normal sinus rhythm Confirmed by Melvenia Motto ([id number]) on 05/05/2024 12:46:32 AM  Radiology: No results found.   Procedures   Medications Ordered in the ED  diazepam (VALIUM) injection 5 mg (5 mg Intravenous Given 05/05/24 0257)    Clinical Course as of 05/05/24 0614  Tue May 05, 2024  0329 BP improved with proper cuff placement.  [LM]    Clinical Course User Index [LM] Beverley Leita LABOR, PA-C                                 Medical Decision Making Risk Prescription drug management.   This patient presents to the ED for concern of left side chest pain, this involves an extensive number of treatment options, and is a complaint that carries with it a high risk of complications and morbidity.  The differential diagnosis includes ACS, PE, msk pain   Co morbidities / Chronic conditions that complicate the patient evaluation  HTN, PE on Eliquis , a fib s/p ablation    Additional history obtained:  Additional history obtained from EMR External records from outside source obtained and reviewed including prior CT chest 02/22/24 with small segmental acute PE right middle lobe Echo 8/3/025 with EF 55-60% Bilateral lower extremity doppler negative for DVT   Lab Tests:  I Ordered, and personally interpreted labs.  The pertinent results include:  troponin negative x 2. Dimer negative. Hcg negative. CBC with non specific leukocytosis, similar to prior. BMP without significant findings.    Cardiac Monitoring: / EKG:  The patient was maintained on a cardiac monitor.  I personally viewed and interpreted the cardiac monitored which showed an underlying rhythm of: NSR, rate 94   Problem List / ED Course / Critical interventions / Medication management  28 yo female presents with right side chest pain. On Eliquis  for PE diagnosed 01/2024, compliant with her medications. Work up reassuring including negative dimer. Recommend follow up with hematology as scheduled tomorrow.  I ordered medication including valium   Reevaluation of  the patient after these medicines showed that the patient stable I have reviewed the patients home medicines and have made adjustments as needed   Social Determinants of Health:  Lives with family, has specialty care team   Test / Admission - Considered:  Stable for dc      Final diagnoses:  Atypical chest pain    ED Discharge Orders     None          Beverley Leita LABOR DEVONNA 05/05/24 [id number]    Melvenia Motto, MD 05/05/24 [phone]

## 2024-05-06 ENCOUNTER — Inpatient Hospital Stay: Attending: Cardiovascular Disease | Admitting: Oncology

## 2024-05-06 ENCOUNTER — Encounter: Payer: Self-pay | Admitting: Oncology

## 2024-05-06 ENCOUNTER — Inpatient Hospital Stay

## 2024-05-06 VITALS — BP 126/68 | HR 93 | Temp 99.1°F | Resp 17 | Ht 67.0 in | Wt 290.0 lb

## 2024-05-06 DIAGNOSIS — I4891 Unspecified atrial fibrillation: Secondary | ICD-10-CM | POA: Insufficient documentation

## 2024-05-06 DIAGNOSIS — I2693 Single subsegmental pulmonary embolism without acute cor pulmonale: Secondary | ICD-10-CM

## 2024-05-06 DIAGNOSIS — Z79899 Other long term (current) drug therapy: Secondary | ICD-10-CM | POA: Insufficient documentation

## 2024-05-06 DIAGNOSIS — Z86718 Personal history of other venous thrombosis and embolism: Secondary | ICD-10-CM | POA: Diagnosis present

## 2024-05-06 DIAGNOSIS — Z0000 Encounter for general adult medical examination without abnormal findings: Secondary | ICD-10-CM | POA: Insufficient documentation

## 2024-05-06 DIAGNOSIS — I10 Essential (primary) hypertension: Secondary | ICD-10-CM | POA: Diagnosis not present

## 2024-05-06 DIAGNOSIS — Z86711 Personal history of pulmonary embolism: Secondary | ICD-10-CM | POA: Insufficient documentation

## 2024-05-06 DIAGNOSIS — Z7901 Long term (current) use of anticoagulants: Secondary | ICD-10-CM | POA: Diagnosis not present

## 2024-05-06 LAB — CMP (CANCER CENTER ONLY)
ALT: 49 U/L — ABNORMAL HIGH (ref 0–44)
AST: 32 U/L (ref 15–41)
Albumin: 4.3 g/dL (ref 3.5–5.0)
Alkaline Phosphatase: 72 U/L (ref 38–126)
Anion gap: 6 (ref 5–15)
BUN: 15 mg/dL (ref 6–20)
CO2: 27 mmol/L (ref 22–32)
Calcium: 9.6 mg/dL (ref 8.9–10.3)
Chloride: 105 mmol/L (ref 98–111)
Creatinine: 0.6 mg/dL (ref 0.44–1.00)
GFR, Estimated: >60 mL/min (ref >60)
Glucose, Bld: 103 mg/dL — ABNORMAL HIGH (ref 70–99)
Potassium: 4 mmol/L (ref 3.5–5.1)
Sodium: 138 mmol/L (ref 135–145)
Total Bilirubin: 0.4 mg/dL (ref 0.0–1.2)
Total Protein: 7.5 g/dL (ref 6.5–8.1)

## 2024-05-06 LAB — CBC WITH DIFFERENTIAL (CANCER CENTER ONLY)
Abs Immature Granulocytes: 0.02 K/uL (ref 0.00–0.07)
Basophils Absolute: 0.1 K/uL (ref 0.0–0.1)
Basophils Relative: 1 %
Eosinophils Absolute: 0.3 K/uL (ref 0.0–0.5)
Eosinophils Relative: 3 %
HCT: 43.8 % (ref 36.0–46.0)
Hemoglobin: 14.7 g/dL (ref 12.0–15.0)
Immature Granulocytes: 0 %
Lymphocytes Relative: 30 %
Lymphs Abs: 3.1 K/uL (ref 0.7–4.0)
MCH: 28.8 pg (ref 26.0–34.0)
MCHC: 33.6 g/dL (ref 30.0–36.0)
MCV: 85.9 fL (ref 80.0–100.0)
Monocytes Absolute: 0.7 K/uL (ref 0.1–1.0)
Monocytes Relative: 7 %
Neutro Abs: 6.1 K/uL (ref 1.7–7.7)
Neutrophils Relative %: 59 %
Platelet Count: 250 K/uL (ref 150–400)
RBC: 5.1 MIL/uL (ref 3.87–5.11)
RDW: 13.6 % (ref 11.5–15.5)
WBC Count: 10.3 K/uL (ref 4.0–10.5)
nRBC: 0 % (ref 0.0–0.2)

## 2024-05-06 LAB — D-DIMER, QUANTITATIVE: D-Dimer, Quant: <0.27 ug{FEU}/mL (ref 0.00–0.50)

## 2024-05-06 NOTE — Progress Notes (Signed)
 [facility]  HEMATOLOGY CLINIC CONSULTATION NOTE   PATIENT NAME: Eileen Buck   MR#: [id number] DOB: 06/05/1996  DATE OF SERVICE: 05/06/2024   REFERRING PROVIDER  Jerel Balding, MD  Patient Care Team: Suanne Pfeiffer, NP as PCP - General (Adult Health Nurse Practitioner) Croitoru, Jerel, MD as PCP - Cardiology (Cardiology) Inocencio Soyla Lunger, MD as PCP - Electrophysiology (Cardiology)   REASON FOR CONSULTATION/ CHIEF COMPLAINT:  Small, segmental right middle lobe pulmonary embolism, diagnosed in August 2025  ASSESSMENT & PLAN:  Eileen Buck is a 28 y.o. lady with a past medical history of severe hypertension, atrial fibrillation status post ablation in 2023, morbid obesity, mild OSA, diagnosed with small segmental right middle lobe pulmonary embolism in August 2025, was referred to our service for evaluation given her history of pulmonary embolism.    Single subsegmental pulmonary embolism without acute cor pulmonale (HCC) Recurrent right-sided chest pain similar to previous PE episode in August 2025.   D-dimer undetectable, indicating no new clotting. Pain has decreased in intensity.  Family history of blood clots. No identifiable triggering risk factors for PE.   Currently on Eliquis , well-tolerated.   Plan to continue anticoagulation for six months, with potential extension if no triggering cause is identified or if blood tests are abnormal.   Alternative options include reducing Eliquis  to prophylactic dose or using baby aspirin if anticoagulation is not preferred. - Continue Eliquis  for six months  Today we pursued thrombophilia workup with factor V Leiden mutation, prothrombin gene mutation, beta-2 glycoprotein antibodies, anticardiolipin antibodies, lupus anticoagulant, Antithrombin III activity, protein C activity, protein S activity.  D-dimer remains undetectable today.  I will discuss results of this workup over the phone in the next 3  weeks  - Scheduled CT angiogram of chest in late February 2026.  - Will follow up after CT angiogram to discuss further management  Healthcare maintenance She is due for Pap smear and she will schedule this with her GYN doctor.   Atrial fibrillation, status post ablation Atrial fibrillation previously treated with ablation. No current symptoms of AFib. Previously on Eliquis  for anticoagulation post-ablation.  Essential hypertension Blood pressure control has been challenging in the past but is currently well-controlled. No recent episodes of elevated blood pressure reported. - Continue current blood pressure management regimen  I reviewed lab results and outside records for this visit and discussed relevant results with the patient. Diagnosis, plan of care and treatment options were also discussed in detail with the patient. Opportunity provided to ask questions and answers provided to her apparent satisfaction. Provided instructions to call our clinic with any problems, questions or concerns prior to return visit. I recommended to continue follow-up with PCP and sub-specialists. She verbalized understanding and agreed with the plan. No barriers to learning was detected.  Vonnetta Akey, MD  05/06/2024 6:05 PM  [facility] - [facility] DE[facility] [address] [place] [address] Dept: [phone] Dept Fax: [phone]   HISTORY OF PRESENT ILLNESS:  Discussed the use of AI scribe software for clinical note transcription with the patient, who gave verbal consent to proceed.  History of Present Illness Eileen Buck is a 28 year old female with a history of pulmonary embolism who presents with recurrent chest pain.  She presented to the ED on 02/21/2024 with complaints of right-sided chest pain.  Initially she was noted to be tachycardic.  O2 saturations  were within normal limits on room air.  CT chest angiogram  on 02/22/2024 showed small segmental acute pulmonary embolism in the right middle lobe.  No central or lobar pulmonary embolism.  There was no evidence of DVT on bilateral lower extremity ultrasound.  Patient was admitted to the hospital for observation. Started on Eliquis .  She was referred to us  for additional evaluation and management, given her history of pulmonary embolism.  She has been experiencing recurrent chest pain similar to her previous pulmonary embolism episode from late August to early September. The pain initially presented on the right side and in the back, which she initially thought was indigestion or a pulled muscle. The pain was sharp and intense but has since decreased in intensity. She visited the emergency room on Monday night due to these symptoms, where a D-dimer test was performed and found to be undetectable.  She has a history of high blood pressure, which has been difficult to control in the past but is currently under better control. She was previously on Eliquis  for atrial fibrillation following a heart ablation in 2023 and is currently on Eliquis  again for the pulmonary embolism. She has been tolerating the medication well. She was on blood thinners for about a year following her heart ablation.  There is a family history of blood clots, as her mother has experienced multiple clots and is currently on Xarelto. Her grandmother had ovarian cancer, and her mother has had a partial hysterectomy due to ovarian issues.  She works from home for [employer], scheduling cardiac PET scans, MRIs, and CT scans. She denies any recent surgeries, birth control use, long trips, or other known risk factors for clot formation. She does not smoke or drink alcohol.  She noticed a hard spot on her leg about three months ago. No urinary symptoms such as burning during urination. She has not had a Pap smear in a couple of years and plans to schedule one soon.   Negative Thrombotic Risk Factors:  Previous VTE, Recent surgery (within 3 months), Recent trauma (within 3 months), Recent admission to hospital with acute illness (within 3 months), Paralysis, paresis, or recent plaster cast immobilization of lower extremity, Central venous catheterization, Bed rest >72 hours within 3 months, Sedentary journey lasting >8 hours within 4 weeks, Pregnancy, Within 6 weeks postpartum, Recent cesarean section (within 3 months), Estrogen therapy, Erythropoiesis-stimulating agent, Recent COVID diagnosis (within 3 months), Active cancer, Non-malignant, chronic inflammatory condition, Known thrombophilic condition, Smoking, Older age.  She denies fever, cough, diarrhea, or other infectious symptoms.  She denies epistaxis, bloody stool, melena, hematuria, bruising or other bleeding symptoms. She also denies unintentional weight loss, night sweats or other constitutional symptoms.  MEDICAL HISTORY Past Medical History:  Diagnosis Date   Acute appendicitis 09/16/2011   IMO SNOMED Dx Update Oct 2024     Anxiety    xanax in past, pt states this is no longer a problem   Atrial fibrillation (HCC)    Chronic hypertension affecting pregnancy 11/18/2018   On daily bASA, not taking anti-hypertensives     Headache    Hypertension    Nausea    Weakness      SURGICAL HISTORY Past Surgical History:  Procedure Laterality Date   ANKLE SURGERY  04/2005   APPENDECTOMY  09/16/11   ATRIAL FIBRILLATION ABLATION N/A 01/17/2022   Procedure: ATRIAL FIBRILLATION ABLATION;  Surgeon: Inocencio Soyla Lunger, MD;  Location: [facility];  Service: Cardiovascular;  Laterality: N/A;   LAPAROSCOPIC APPENDECTOMY  09/16/2011   Procedure: APPENDECTOMY LAPAROSCOPIC;  Surgeon: Krystal CHRISTELLA Spinner, MD;  Location: [facility];  Service: General;  Laterality: N/A;   NASAL SINUS SURGERY  08/2010     SOCIAL HISTORY: She reports that she has never smoked. She has never used smokeless tobacco. She reports that she does not drink alcohol and does  not use drugs. Social History   Socioeconomic History   Marital status: Single    Spouse name: Not on file   Number of children: Not on file   Years of education: Not on file   Highest education level: Not on file  Occupational History   Not on file  Tobacco Use   Smoking status: Never   Smokeless tobacco: Never   Tobacco comments:    Never smoke 09/28/21  Vaping Use   Vaping status: Never Used  Substance and Sexual Activity   Alcohol use: No   Drug use: No   Sexual activity: Yes    Birth control/protection: None  Other Topics Concern   Not on file  Social History Narrative   Not on file   Social Drivers of Health   Financial Resource Strain: Low Risk  (01/06/2024)   Received from [facility]   Overall Financial Resource Strain (CARDIA)    How hard is it for you to pay for the very basics like food, housing, medical care, and heating?: Not very hard  Food Insecurity: No Food Insecurity (05/06/2024)   Hunger Vital Sign    Worried About Running Out of Food in the Last Year: Never true    Ran Out of Food in the Last Year: Never true  Transportation Needs: No Transportation Needs (05/06/2024)   PRAPARE - Administrator, Civil Service (Medical): No    Lack of Transportation (Non-Medical): No  Physical Activity: Insufficiently Active (01/06/2024)   Received from [facility]   Exercise Vital Sign    On average, how many days per week do you engage in moderate to strenuous exercise (like a brisk walk)?: 3 days    On average, how many minutes do you engage in exercise at this level?: 10 min  Stress: No Stress Concern Present (01/06/2024)   Received from [facility] - Occupational Stress Questionnaire    Do you feel stress - tense, restless, nervous, or anxious, or unable to sleep at night because your mind is troubled all the time - these days?: Not at all  Social Connections: Moderately Integrated (01/06/2024)    [facility]   Social Network    How would you rate your social network (family, work, friends)?: Adequate participation with social networks  Intimate Partner Violence: Not At Risk (05/06/2024)   Humiliation, Afraid, Rape, and Kick questionnaire    Fear of Current or Ex-Partner: No    Emotionally Abused: No    Physically Abused: No    Sexually Abused: No    FAMILY HISTORY: Her family history includes Cancer in her paternal grandmother; Heart disease in her father; Hypertension in her mother.  CURRENT MEDICATIONS   Current Outpatient Medications  Medication Instructions   amLODipine  (NORVASC ) 5 mg, Oral, Daily   carvedilol  (COREG ) 12.5 mg, Oral, 2 times daily with meals   Eliquis  5 mg, Oral, 2 times daily   hydrochlorothiazide  (MICROZIDE ) 12.5 mg, Oral, Daily   olmesartan  (BENICAR ) 20 mg, Oral, Daily     ALLERGIES  She has no known allergies.  REVIEW OF SYSTEMS:  Review  of Systems - Oncology   Rest of the pertinent review of systems is unremarkable except as mentioned above in HPI.  PHYSICAL EXAMINATION:    Onc Performance Status - 05/06/24 1119       ECOG Perf Status   ECOG Perf Status Restricted in physically strenuous activity but ambulatory and able to carry out work of a light or sedentary nature, e.g., light house work, office work      KPS SCALE   KPS % SCORE Normal activity with effort, some s/s of disease          Vitals:   05/06/24 1101 05/06/24 1106  BP: (!) 144/92 126/68  Pulse: 93   Resp: 17   Temp: 99.1 F (37.3 C)   SpO2: 100%    Filed Weights   05/06/24 1101  Weight: 290 lb (131.5 kg)    Physical Exam Constitutional:      General: She is not in acute distress.    Appearance: Normal appearance.  HENT:     Head: Normocephalic and atraumatic.  Cardiovascular:     Rate and Rhythm: Normal rate.  Pulmonary:     Effort: Pulmonary effort is normal. No respiratory distress.  Abdominal:     General: There is no distension.   Neurological:     General: No focal deficit present.     Mental Status: She is alert and oriented to person, place, and time.  Psychiatric:        Mood and Affect: Mood normal.        Behavior: Behavior normal.      LABORATORY DATA:   I have reviewed the data as listed.  Results for orders placed or performed in visit on 05/06/24  CBC with Differential (Cancer Center Only)  Result Value Ref Range   WBC Count 10.3 4.0 - 10.5 K/uL   RBC 5.10 3.87 - 5.11 MIL/uL   Hemoglobin 14.7 12.0 - 15.0 g/dL   HCT 56.1 63.9 - 53.9 %   MCV 85.9 80.0 - 100.0 fL   MCH 28.8 26.0 - 34.0 pg   MCHC 33.6 30.0 - 36.0 g/dL   RDW 86.3 88.4 - 84.4 %   Platelet Count 250 150 - 400 K/uL   nRBC 0.0 0.0 - 0.2 %   Neutrophils Relative % 59 %   Neutro Abs 6.1 1.7 - 7.7 K/uL   Lymphocytes Relative 30 %   Lymphs Abs 3.1 0.7 - 4.0 K/uL   Monocytes Relative 7 %   Monocytes Absolute 0.7 0.1 - 1.0 K/uL   Eosinophils Relative 3 %   Eosinophils Absolute 0.3 0.0 - 0.5 K/uL   Basophils Relative 1 %   Basophils Absolute 0.1 0.0 - 0.1 K/uL   Immature Granulocytes 0 %   Abs Immature Granulocytes 0.02 0.00 - 0.07 K/uL  CMP (Cancer Center only)  Result Value Ref Range   Sodium 138 135 - 145 mmol/L   Potassium 4.0 3.5 - 5.1 mmol/L   Chloride 105 98 - 111 mmol/L   CO2 27 22 - 32 mmol/L   Glucose, Bld 103 (H) 70 - 99 mg/dL   BUN 15 6 - 20 mg/dL   Creatinine 9.39 9.55 - 1.00 mg/dL   Calcium 9.6 8.9 - 89.6 mg/dL   Total Protein 7.5 6.5 - 8.1 g/dL   Albumin 4.3 3.5 - 5.0 g/dL   AST 32 15 - 41 U/L   ALT 49 (H) 0 - 44 U/L   Alkaline Phosphatase 72 38 - 126 U/L  Total Bilirubin 0.4 0.0 - 1.2 mg/dL   GFR, Estimated >39 >39 mL/min   Anion gap 6 5 - 15  D-dimer, quantitative  Result Value Ref Range   D-Dimer, Quant <0.27 0.00 - 0.50 ug/mL-FEU     RADIOGRAPHIC STUDIES:  I have personally reviewed the radiological images as listed and agreed with the findings in the report.  VAS US  LOWER EXTREMITY VENOUS  (DVT)  Lower Venous DVT Study  Patient Name:  Eileen Buck  Date of Exam:   02/23/2024 Medical Rec #: [id number]         Accession #:    [id number] Date of Birth: 1995/12/02          Patient Gender: F Patient Age:   28 years Exam Location:  [facility] Procedure:      VAS US  LOWER EXTREMITY VENOUS (DVT) Referring Phys: DELON HERALD  --------------------------------------------------------------------------------   Indications: Pulmonary embolism.   Risk Factors: History of atrial fibrillation, status post ablation 2023. Comparison Study: Prior left LEV done at [facility] 10/08/22  Performing Technologist: Alberta Lis RVS    Examination Guidelines: A complete evaluation includes B-mode imaging, spectral Doppler, color Doppler, and power Doppler as needed of all accessible portions of each vessel. Bilateral testing is considered an integral part of a complete examination. Limited examinations for reoccurring indications may be performed as noted. The reflux portion of the exam is performed with the patient in reverse Trendelenburg.     +---------+---------------+---------+-----------+----------+--------------+ RIGHT    CompressibilityPhasicitySpontaneityPropertiesThrombus Aging +---------+---------------+---------+-----------+----------+--------------+ CFV      Full           Yes      Yes                                 +---------+---------------+---------+-----------+----------+--------------+ SFJ      Full                                                        +---------+---------------+---------+-----------+----------+--------------+ FV Prox  Full                                                        +---------+---------------+---------+-----------+----------+--------------+ FV Mid   Full                                                        +---------+---------------+---------+-----------+----------+--------------+ FV  DistalFull                                                        +---------+---------------+---------+-----------+----------+--------------+ PFV      Full                                                        +---------+---------------+---------+-----------+----------+--------------+  POP      Full           Yes      Yes                                 +---------+---------------+---------+-----------+----------+--------------+ PTV      Full                                                        +---------+---------------+---------+-----------+----------+--------------+ PERO     Full                                                        +---------+---------------+---------+-----------+----------+--------------+        +---------+---------------+---------+-----------+----------+--------------+ LEFT     CompressibilityPhasicitySpontaneityPropertiesThrombus Aging +---------+---------------+---------+-----------+----------+--------------+ CFV      Full           Yes      Yes                                 +---------+---------------+---------+-----------+----------+--------------+ SFJ      Full                                                        +---------+---------------+---------+-----------+----------+--------------+ FV Prox  Full                                                        +---------+---------------+---------+-----------+----------+--------------+ FV Mid   Full                                                        +---------+---------------+---------+-----------+----------+--------------+ FV DistalFull                                                        +---------+---------------+---------+-----------+----------+--------------+ PFV      Full                                                        +---------+---------------+---------+-----------+----------+--------------+ POP      Full            Yes      No                                  +---------+---------------+---------+-----------+----------+--------------+  PTV      Full                                                        +---------+---------------+---------+-----------+----------+--------------+ PERO     Full                                                        +---------+---------------+---------+-----------+----------+--------------+             Summary: BILATERAL: - No evidence of deep vein thrombosis seen in the lower extremities, bilaterally. -No evidence of popliteal cyst, bilaterally.       *See table(s) above for measurements and observations.  Electronically signed by Debby Robertson on 02/24/2024 at 11:28:57 AM.      Final     Orders Placed This Encounter  Procedures   CT Angio Chest Pulmonary Embolism (PE) W or WO Contrast    Standing Status:   Future    Expected Date:   08/17/2024    Expiration Date:   05/06/2025    If indicated for the ordered procedure, I authorize the administration of contrast media per Radiology protocol:   Yes    Does the patient have a contrast media/X-ray dye allergy?:   No    Is patient pregnant?:   No    Preferred imaging location?:   [facility]   CBC with Differential (Cancer Center Only)    Standing Status:   Future    Number of Occurrences:   1    Expiration Date:   05/06/2025   CMP (Cancer Center only)    Standing Status:   Future    Number of Occurrences:   1    Expiration Date:   05/06/2025   D-dimer, quantitative    Standing Status:   Future    Number of Occurrences:   1    Expiration Date:   05/06/2025   Cardiolipin antibodies, IgG, IgM, IgA    Standing Status:   Future    Number of Occurrences:   1    Expiration Date:   05/06/2025   Beta-2-glycoprotein i abs, IgG/M/A    Standing Status:   Future    Number of Occurrences:   1    Expiration Date:   05/06/2025   Lupus anticoagulant panel    Standing Status:    Future    Number of Occurrences:   1    Expiration Date:   05/06/2025   Factor 5 leiden    Standing Status:   Future    Number of Occurrences:   1    Expiration Date:   05/06/2025   Prothrombin gene mutation    Standing Status:   Future    Number of Occurrences:   1    Expiration Date:   05/06/2025   PROTEIN S PANEL    Standing Status:   Future    Number of Occurrences:   1    Expiration Date:   05/06/2025   Protein C activity    Standing Status:   Future    Number of Occurrences:   1    Expiration Date:   05/06/2025  Antithrombin panel    Standing Status:   Future    Number of Occurrences:   1    Expiration Date:   05/06/2025     I spent a total of 55 minutes during this encounter with the patient including review of chart and various tests results, discussions about plan of care and coordination of care plan.  This document was completed utilizing speech recognition software. Grammatical errors, random word insertions, pronoun errors, and incomplete sentences are an occasional consequence of this system due to software limitations, ambient noise, and hardware issues. Any formal questions or concerns about the content, text or information contained within the body of this dictation should be directly addressed to the provider for clarification.

## 2024-05-06 NOTE — Assessment & Plan Note (Signed)
 She is due for Pap smear and she will schedule this with her GYN doctor.

## 2024-05-06 NOTE — Assessment & Plan Note (Signed)
 Recurrent right-sided chest pain similar to previous PE episode in August 2025.   D-dimer undetectable, indicating no new clotting. Pain has decreased in intensity.  Family history of blood clots. No identifiable triggering risk factors for PE.   Currently on Eliquis , well-tolerated.   Plan to continue anticoagulation for six months, with potential extension if no triggering cause is identified or if blood tests are abnormal.   Alternative options include reducing Eliquis  to prophylactic dose or using baby aspirin if anticoagulation is not preferred. - Continue Eliquis  for six months  Today we pursued thrombophilia workup with factor V Leiden mutation, prothrombin gene mutation, beta-2 glycoprotein antibodies, anticardiolipin antibodies, lupus anticoagulant, Antithrombin III activity, protein C activity, protein S activity.  D-dimer remains undetectable today.  I will discuss results of this workup over the phone in the next 3 weeks  - Scheduled CT angiogram of chest in late February 2026.  - Will follow up after CT angiogram to discuss further management

## 2024-05-08 LAB — BETA-2-GLYCOPROTEIN I ABS, IGG/M/A
Beta-2 Glyco I IgG: <9 GPI IgG units (ref 0–20)
Beta-2-Glycoprotein I IgA: <9 GPI IgA units (ref 0–25)
Beta-2-Glycoprotein I IgM: <9 GPI IgM units (ref 0–32)

## 2024-05-08 LAB — CARDIOLIPIN ANTIBODIES, IGG, IGM, IGA
Anticardiolipin IgA: <9 U/mL (ref 0–11)
Anticardiolipin IgG: <9 GPL U/mL (ref 0–14)
Anticardiolipin IgM: 11 [MPL'U]/mL (ref 0–12)

## 2024-05-09 LAB — PROTEIN S PANEL
Protein S Activity: 109 % (ref 63–140)
Protein S Ag, Free: 153 % — ABNORMAL HIGH (ref 61–136)
Protein S Ag, Total: 107 % (ref 60–150)

## 2024-05-09 LAB — ANTITHROMBIN PANEL
AT III AG PPP IMM-ACNC: 74 % (ref 72–124)
Antithrombin Activity: 133 % (ref 75–135)

## 2024-05-09 LAB — LUPUS ANTICOAGULANT PANEL
DRVVT: 46.4 s (ref 0.0–47.0)
PTT Lupus Anticoagulant: 52 s — ABNORMAL HIGH (ref 0.0–43.5)

## 2024-05-09 LAB — PROTEIN C ACTIVITY: Protein C Activity: 155 % (ref 73–180)

## 2024-05-09 LAB — HEXAGONAL PHASE PHOSPHOLIPID: Hexagonal Phase Phospholipid: 5 s (ref 0–11)

## 2024-05-09 LAB — PTT-LA MIX: PTT-LA Mix: 48.6 s — ABNORMAL HIGH (ref 0.0–40.5)

## 2024-05-11 LAB — FACTOR 5 LEIDEN

## 2024-05-12 LAB — PROTHROMBIN GENE MUTATION

## 2024-05-27 ENCOUNTER — Inpatient Hospital Stay: Admitting: Oncology

## 2024-05-27 DIAGNOSIS — I2693 Single subsegmental pulmonary embolism without acute cor pulmonale: Secondary | ICD-10-CM | POA: Diagnosis not present

## 2024-05-27 NOTE — Assessment & Plan Note (Addendum)
 Diagnosed with single subsegmental pulmonary embolism in August 2025.    Family history of blood clots. No identifiable triggering risk factors for PE.   Currently on Eliquis , well-tolerated.   On her consultation with us  on 05/06/2024, we pursued thrombophilia workup.  Negative workup for clotting disorders including anti-cardiolipin antibodies, beta 2 glycoprotein antibodies, lupus anticoagulant, factor V Leiden, prothrombin gene mutation, protein C, protein S, and antithrombin III . D-dimer was undetectable. PTT slightly elevated due to Eliquis , but no lupus anticoagulant detected. In one third of cases, the cause of the clot remains unclear.  - Continue Eliquis  until follow-up scan in February. - Scheduled follow-up scan on February 20th. - Will discuss potential reduction of Eliquis  dosage or switch to aspirin if scan results are clear.

## 2024-05-27 NOTE — Progress Notes (Signed)
 "  [facility]  HEMATOLOGY-ONCOLOGY ELECTRONIC VISIT PROGRESS NOTE  PATIENT NAME: Eileen Buck   MR#: [id number] DOB: [birth year]  DATE OF SERVICE: 05/27/2024  Patient Care Team: Suanne Pfeiffer, NP as PCP - General (Adult Health Nurse Practitioner) Francyne Headland, MD as PCP - Cardiology (Cardiology) Inocencio Soyla Lunger, MD as PCP - Electrophysiology (Cardiology)  I connected with the patient via telephone conference and verified that I am speaking with the correct person using two identifiers. The patient's location is at home and I am providing care from the [facility].  I discussed the limitations, risks, security and privacy concerns of performing an evaluation and management service by e-visits and the availability of in person appointments. I also discussed with the patient that there may be a patient responsible charge related to this service. The patient expressed understanding and agreed to proceed.   ASSESSMENT & PLAN:   Eileen Buck is a 28 y.o. lady with a past medical history of severe hypertension, atrial fibrillation status post ablation in 2023, morbid obesity, mild OSA, diagnosed with small segmental right middle lobe pulmonary embolism in August 2025, was referred to our service for evaluation given her history of pulmonary embolism.    Single subsegmental pulmonary embolism without acute cor pulmonale (HCC) Diagnosed with single subsegmental pulmonary embolism in August 2025.    Family history of blood clots. No identifiable triggering risk factors for PE.   Currently on Eliquis , well-tolerated.   On her consultation with us  on 05/06/2024, we pursued thrombophilia workup.  Negative workup for clotting disorders including anti-cardiolipin antibodies, beta 2 glycoprotein antibodies, lupus anticoagulant, factor V Leiden, prothrombin gene mutation, protein C, protein S, and antithrombin III . D-dimer was undetectable. PTT slightly elevated due to  Eliquis , but no lupus anticoagulant detected. In one third of cases, the cause of the clot remains unclear.  - Continue Eliquis  until follow-up scan in February. - Scheduled follow-up scan on February 20th. - Will discuss potential reduction of Eliquis  dosage or switch to aspirin if scan results are clear.   Atrial fibrillation Well-controlled with no recent episodes. Discussed the potential for AFib to cause blood clots due to blood pooling in the atrium. Currently managed with Eliquis , which is also used for the pulmonary embolism. - Continue current management with Eliquis . - Will consult with cardiologist Dr. Cortoro regarding long-term management of AFib and anticoagulation therapy.  I discussed the assessment and treatment plan with the patient. The patient was provided an opportunity to ask questions and all were answered. The patient agreed with the plan and demonstrated an understanding of the instructions. The patient was advised to call back or seek an in-person evaluation if the symptoms worsen or if the condition fails to improve as anticipated.    I spent 12 minutes over the phone with the patient reviewing test results, discuss management and coordination/planning of care.  Chinita Patten, MD 05/27/2024 3:47 PM [facility] - A DEPT OF [facility] [address] [place] [address] Dept: [phone] Dept Fax: [phone]   INTERVAL HISTORY:  Please see above for problem oriented charting.  The purpose of today's discussion is to explain recent lab results and to formulate plan of care.  Discussed the use of AI scribe software for clinical note transcription with the patient, who gave verbal consent to proceed.  History of Present Illness Eileen Buck is a 28 year old female with blood clots  and atrial fibrillation who presents for follow-up regarding her anticoagulation therapy.  She has been  experiencing increased tiredness recently, which she attributes to being busy with the holiday season. No recent episodes of atrial fibrillation have been noted.  She is currently taking Eliquis  without any issues. A recent workup to assess her risk of blood clots showed normal blood counts, liver function, and electrolytes, except for a slightly elevated ALT level at 49 U/L (normal <45 U/L). Her D-dimer was low, and tests for anti-cardiolipin antibodies, beta 2 glycoprotein antibodies, lupus anticoagulant, factor V Leiden, prothrombin gene mutation, protein C, protein S, and antithrombin III  were all negative.  She has a family history of blood clots, with her mother having experienced them. Previously, she had a small clot on the right side, and there was a possibility of another clot that might have dissolved. A repeat scan is scheduled for February 20th.  She inquired about a lupus anticoagulant panel that showed a high PTT lupus anticoagulant level, which can be affected by her current medication, Eliquis . However, there was no lupus anticoagulant detected.  She has not experienced any recent episodes of atrial fibrillation.   SUMMARY OF HEMATOLOGY HISTORY:  She presented to the ED on 02/21/2024 with complaints of right-sided chest pain.  Initially she was noted to be tachycardic.  O2 saturations were within normal limits on room air.  CT chest angiogram on 02/22/2024 showed small segmental acute pulmonary embolism in the right middle lobe.  No central or lobar pulmonary embolism.  There was no evidence of DVT on bilateral lower extremity ultrasound.  Patient was admitted to the hospital for observation. Started on Eliquis .   She was referred to us  for additional evaluation and management, given her history of pulmonary embolism.   She has been experiencing recurrent chest pain similar to her previous pulmonary embolism episode from late August to early September. The pain initially presented on  the right side and in the back, which she initially thought was indigestion or a pulled muscle. The pain was sharp and intense but has since decreased in intensity. She visited the emergency room on Monday night due to these symptoms, where a D-dimer test was performed and found to be undetectable.   She has a history of high blood pressure, which has been difficult to control in the past but is currently under better control. She was previously on Eliquis  for atrial fibrillation following a heart ablation in 2023 and is currently on Eliquis  again for the pulmonary embolism. She has been tolerating the medication well. She was on blood thinners for about a year following her heart ablation.   There is a family history of blood clots, as her mother has experienced multiple clots and is currently on Xarelto. Her grandmother had ovarian cancer, and her mother has had a partial hysterectomy due to ovarian issues.   She works from home for [employer], scheduling cardiac PET scans, MRIs, and CT scans. She denies any recent surgeries, birth control use, long trips, or other known risk factors for clot formation. She does not smoke or drink alcohol.   She noticed a hard spot on her leg about three months ago. No urinary symptoms such as burning during urination. She has not had a Pap smear in a couple of years and plans to schedule one soon.    Negative Thrombotic Risk Factors: Previous VTE, Recent surgery (within 3 months), Recent trauma (within 3 months), Recent admission to hospital with acute illness (within 3 months),  Paralysis, paresis, or recent plaster cast immobilization of lower extremity, Central venous catheterization, Bed rest >72 hours within 3 months, Sedentary journey lasting >8 hours within 4 weeks, Pregnancy, Within 6 weeks postpartum, Recent cesarean section (within 3 months), Estrogen therapy, Erythropoiesis-stimulating agent, Recent COVID diagnosis (within 3 months), Active cancer, Non-malignant,  chronic inflammatory condition, Known thrombophilic condition, Smoking, Older age.   She denies fever, cough, diarrhea, or other infectious symptoms.  She denies epistaxis, bloody stool, melena, hematuria, bruising or other bleeding symptoms. She also denies unintentional weight loss, night sweats or other constitutional symptoms.  Family history of blood clots in mother.   On her consultation with us  on 05/06/2024, we pursued thrombophilia workup.  Negative workup for clotting disorders including anti-cardiolipin antibodies, beta 2 glycoprotein antibodies, lupus anticoagulant, factor V Leiden, prothrombin gene mutation, protein C, protein S, and antithrombin III . D-dimer was undetectable. PTT slightly elevated due to Eliquis , but no lupus anticoagulant detected. In one third of cases, the cause of the clot remains unclear. - Continue Eliquis  until follow-up scan in February. - Scheduled follow-up scan on February 20th. - Will discuss potential reduction of Eliquis  dosage or switch to aspirin if scan results are clear.  REVIEW OF SYSTEMS:    Review of Systems - Oncology  All other pertinent systems were reviewed with the patient and are negative.  I have reviewed the past medical history, past surgical history, social history and family history with the patient and they are unchanged from previous note.  ALLERGIES:  She has no known allergies.  MEDICATIONS:  Current Outpatient Medications  Medication Sig Dispense Refill   amLODipine  (NORVASC ) 5 MG tablet Take 1 tablet (5 mg total) by mouth daily. 90 tablet 3   apixaban  (ELIQUIS ) 5 MG TABS tablet Take 1 tablet (5 mg total) by mouth 2 (two) times daily. 60 tablet 5   carvedilol  (COREG ) 12.5 MG tablet Take 1 tablet (12.5 mg total) by mouth 2 (two) times daily with a meal. 180 tablet 3   hydrochlorothiazide  (MICROZIDE ) 12.5 MG capsule Take 1 capsule (12.5 mg total) by mouth daily. 90 capsule 3   olmesartan  (BENICAR ) 20 MG tablet Take 1 tablet  (20 mg total) by mouth daily. 90 tablet 3   No current facility-administered medications for this visit.    PHYSICAL EXAMINATION:  Not performed today as it was a phone only visit  LABORATORY DATA:   I have reviewed the data as listed.  Recent Results (from the past 2160 hours)  D-Dimer, Quantitative     Status: None   Collection Time: 05/04/24 10:20 AM  Result Value Ref Range   D-DIMER <0.20 0.00 - 0.49 mg/L FEU    Comment: According to the assay manufacturer's published package insert, a normal (<0.50 mg/L FEU) D-dimer result in conjunction with a non-high clinical probability assessment, excludes deep vein thrombosis (DVT) and pulmonary embolism (PE) with high sensitivity. D-dimer values increase with age and this can make VTE exclusion of an older population difficult. To address this, the [employer], based on best available evidence and recent guidelines, recommends that clinicians use age-adjusted D-dimer thresholds in patients greater than 53 years of age with: a) a low probability of PE who do not meet all Pulmonary Embolism Rule Out Criteria, or b) in those with intermediate probability of PE. The formula for an age-adjusted D-dimer cut-off is age/100. For example, a 30 year old patient would have an age-adjusted cut-off of 0.60 mg/L FEU and an 30 year old 0.80 mg/L  FEU.   CBC with Differential     Status: Abnormal   Collection Time: 05/04/24  8:29 PM  Result Value Ref Range   WBC 10.9 (H) 4.0 - 10.5 K/uL   RBC 4.67 3.87 - 5.11 MIL/uL   Hemoglobin 13.3 12.0 - 15.0 g/dL   HCT 58.5 63.9 - 53.9 %   MCV 88.7 80.0 - 100.0 fL   MCH 28.5 26.0 - 34.0 pg   MCHC 32.1 30.0 - 36.0 g/dL   RDW 86.4 88.4 - 84.4 %   Platelets 226 150 - 400 K/uL   nRBC 0.0 0.0 - 0.2 %   Neutrophils Relative % 48 %   Neutro Abs 5.3 1.7 - 7.7 K/uL   Lymphocytes Relative 40 %   Lymphs Abs 4.3 (H) 0.7 - 4.0 K/uL   Monocytes Relative 7 %   Monocytes Absolute 0.7 0.1 - 1.0  K/uL   Eosinophils Relative 4 %   Eosinophils Absolute 0.4 0.0 - 0.5 K/uL   Basophils Relative 1 %   Basophils Absolute 0.1 0.0 - 0.1 K/uL   Immature Granulocytes 0 %   Abs Immature Granulocytes 0.03 0.00 - 0.07 K/uL    Comment: Performed at [facility], [address] [place], [place] [address]  Basic metabolic panel     Status: Abnormal   Collection Time: 05/04/24  8:29 PM  Result Value Ref Range   Sodium 145 135 - 145 mmol/L   Potassium 4.1 3.5 - 5.1 mmol/L   Chloride 103 98 - 111 mmol/L   CO2 25 22 - 32 mmol/L   Glucose, Bld 104 (H) 70 - 99 mg/dL    Comment: Glucose reference range applies only to samples taken after fasting for at least 8 hours.   BUN 10 6 - 20 mg/dL   Creatinine, Ser 9.29 0.44 - 1.00 mg/dL   Calcium 9.3 8.9 - 89.6 mg/dL   GFR, Estimated >39 >39 mL/min    Comment: (NOTE) Calculated using the CKD-EPI Creatinine Equation (2021)    Anion gap 17 (H) 5 - 15    Comment: Performed at [facility], [address], [place], [place] [address]  Troponin I (High Sensitivity)     Status: None   Collection Time: 05/04/24  8:29 PM  Result Value Ref Range   Troponin I (High Sensitivity) 3 <18 ng/L    Comment: (NOTE) Elevated high sensitivity troponin I (hsTnI) values and significant  changes across serial measurements may suggest ACS but many other  chronic and acute conditions are known to elevate hsTnI results.  Refer to the Links section for chest pain algorithms and additional  guidance. Performed at [facility], [address], [place], [place] [address]   D-dimer, quantitative     Status: None   Collection Time: 05/04/24  8:29 PM  Result Value Ref Range   D-Dimer, Quant <0.27 0.00 - 0.50 ug/mL-FEU    Comment: (NOTE) At the manufacturer cut-off value of 0.5 g/mL FEU, this assay has a negative predictive value of 95-100%.This assay is intended for use in conjunction with a clinical pretest probability (PTP) assessment model to exclude  pulmonary embolism (PE) and deep venous thrombosis (DVT) in outpatients suspected of PE or DVT. Results should be correlated with clinical presentation. Performed at [facility], [address], [place], [place] [address]   hCG, serum, qualitative     Status: None   Collection Time: 05/04/24  8:29 PM  Result Value Ref Range  Preg, Serum NEGATIVE NEGATIVE    Comment:        THE SENSITIVITY OF THIS METHODOLOGY IS >10 mIU/mL. Performed at [facility], [address], [place], [place] [address]   Troponin I (High Sensitivity)     Status: None   Collection Time: 05/04/24 11:01 PM  Result Value Ref Range   Troponin I (High Sensitivity) 3 <18 ng/L    Comment: (NOTE) Elevated high sensitivity troponin I (hsTnI) values and significant  changes across serial measurements may suggest ACS but many other  chronic and acute conditions are known to elevate hsTnI results.  Refer to the Links section for chest pain algorithms and additional  guidance. Performed at [facility], [address], [place], [place] [address]   CBC with Differential (Cancer Center Only)     Status: None   Collection Time: 05/06/24 12:17 PM  Result Value Ref Range   WBC Count 10.3 4.0 - 10.5 K/uL   RBC 5.10 3.87 - 5.11 MIL/uL   Hemoglobin 14.7 12.0 - 15.0 g/dL   HCT 56.1 63.9 - 53.9 %   MCV 85.9 80.0 - 100.0 fL   MCH 28.8 26.0 - 34.0 pg   MCHC 33.6 30.0 - 36.0 g/dL   RDW 86.3 88.4 - 84.4 %   Platelet Count 250 150 - 400 K/uL   nRBC 0.0 0.0 - 0.2 %   Neutrophils Relative % 59 %   Neutro Abs 6.1 1.7 - 7.7 K/uL   Lymphocytes Relative 30 %   Lymphs Abs 3.1 0.7 - 4.0 K/uL   Monocytes Relative 7 %   Monocytes Absolute 0.7 0.1 - 1.0 K/uL   Eosinophils Relative 3 %   Eosinophils Absolute 0.3 0.0 - 0.5 K/uL   Basophils Relative 1 %   Basophils Absolute 0.1 0.0 - 0.1 K/uL   Immature Granulocytes 0 %   Abs Immature Granulocytes 0.02 0.00 - 0.07 K/uL    Comment: Performed at [facility], [address] [place], [place] [address]  CMP (Cancer Center only)     Status: Abnormal   Collection Time: 05/06/24 12:17 PM  Result Value Ref Range   Sodium 138 135 - 145 mmol/L   Potassium 4.0 3.5 - 5.1 mmol/L   Chloride 105 98 - 111 mmol/L   CO2 27 22 - 32 mmol/L    Comment: (NOTE) Elevated LDH levels may cause falsely increased CO2 results. If LDH is >2000 U/L, a positive bias of 12% is possible.     Glucose, Bld 103 (H) 70 - 99 mg/dL    Comment: Glucose reference range applies only to samples taken after fasting for at least 8 hours.   BUN 15 6 - 20 mg/dL   Creatinine 9.39 9.55 - 1.00 mg/dL   Calcium 9.6 8.9 - 89.6 mg/dL   Total Protein 7.5 6.5 - 8.1 g/dL   Albumin 4.3 3.5 - 5.0 g/dL   AST 32 15 - 41 U/L   ALT 49 (H) 0 - 44 U/L   Alkaline Phosphatase 72 38 - 126 U/L   Total Bilirubin 0.4 0.0 - 1.2 mg/dL   GFR, Estimated >39 >39 mL/min    Comment: (NOTE) Calculated using the CKD-EPI Creatinine Equation (2021)    Anion gap 6 5 - 15    Comment: Performed at [facility], [address] [place], [place] [address]  D-dimer, quantitative     Status: None   Collection Time: 05/06/24 12:17  PM  Result Value Ref Range   D-Dimer, Quant <0.27 0.00 - 0.50 ug/mL-FEU    Comment: (NOTE) At the manufacturer cut-off value of 0.5 g/mL FEU, this assay has a negative predictive value of 95-100%.This assay is intended for use in conjunction with a clinical pretest probability (PTP) assessment model to exclude pulmonary embolism (PE) and deep venous thrombosis (DVT) in outpatients suspected of PE or DVT. Results should be correlated with clinical presentation. Performed at [facility], [address], [place], [place] [address]   Cardiolipin antibodies, IgG, IgM, IgA     Status: None   Collection Time: 05/06/24 12:17 PM  Result Value Ref Range   Anticardiolipin IgG <9 0 - 14 GPL U/mL    Comment: (NOTE)                           Negative:              <15                          Indeterminate:     15 - 20                          Low-Med Positive: >20 - 80                          High Positive:         >80    Anticardiolipin IgM 11 0 - 12 MPL U/mL    Comment: (NOTE)                          Negative:              <13                          Indeterminate:     13 - 20                          Low-Med Positive: >20 - 80                          High Positive:         >80    Anticardiolipin IgA <9 0 - 11 APL U/mL    Comment: (NOTE)                          Negative:              <12                          Indeterminate:     12 - 20                          Low-Med Positive: >20 - 80                          High Positive:         >80 Performed At: [facility] [address] [place], [place] [id number] Jennette Shorter MD Ey:[id number]   Beta-2 -glycoprotein i abs,  IgG/M/A     Status: None   Collection Time: 05/06/24 12:17 PM  Result Value Ref Range   Beta-2  Glyco I IgG <9 0 - 20 GPI IgG units   Beta-2 -Glycoprotein I IgM <9 0 - 32 GPI IgM units    Comment: (NOTE) Performed At: [facility] [address] [place], [place] [id number] Jennette Shorter MD Ey:[id number]    Beta-2 -Glycoprotein I IgA <9 0 - 25 GPI IgA units  Lupus anticoagulant panel     Status: Abnormal   Collection Time: 05/06/24 12:17 PM  Result Value Ref Range   PTT Lupus Anticoagulant 52.0 (H) 0.0 - 43.5 sec   DRVVT 46.4 0.0 - 47.0 sec   Lupus Anticoag Interp Comment:     Comment: (NOTE) No lupus anticoagulant was detected. Results suggest the presence of an inhibitor.  The presence of heparin , which is a non-specific inhibitor, may cause this pattern of results. Since the PTT-LA was extended and the dRVVT was within normal limits, a specific inhibitor to factor VIII, IX, XI, or XII cannot be excluded. It should be noted that mixing studies performed on samples with minimally extended PTT-LA results can  be equivocal.  Normal plasma can overcome weak inhibitors, also resulting in a correction of the mixing study. As antibody titers may fluctuate with time, repeat testing may be indicated and ideally should be performed in the absence of anticoagulant therapy. Performed At: [facility] [address] [place], [place] [id number] Jennette Shorter MD Ey:[id number]   Factor 5 leiden     Status: None   Collection Time: 05/06/24 12:17 PM  Result Value Ref Range   Recommendations-F5LEID: Comment     Comment: (NOTE) Result: c.1601G>A (p.Arg534Gln) - Not Detected This result is not associated with an increased risk for venous thromboembolism. See Additional Clinical Information and Comments. Additional Clinical Information:    Venous thromboembolism is a multifactorial disease influenced by genetic, environmental, and circumstantial risk factors. The c.1601G>A (p. Arg534Gln) variant in the F5 gene, commonly referred to as Factor V Leiden, is a genetic risk factor for venous thromboembolism. Heterozygous carriers of this variant have a 6- to 8- fold increased risk for venous thromboembolism. Individuals homozygous for this variant (ie, with a copy of the variant on each chromosome) have an approximately 80-fold increased risk for venous thromboembolism. Individuals who carry both a c.*97G>A variant in the F2 gene and Factor V Leiden have an approximately 20-fold increased risk for venous thromboembolism. Risks are likely to be even higher in more complex genotype combinations  involving the F2 c.*97G>A variant and Factor V Leiden (PMID: 66325232). Additional risk factors include but are not limited to: deficiency of protein C, protein S, or antithrombin III , age, female sex, personal or family history of deep vein thromboembolism, smoking, surgery, prolonged immobilization, malignant neoplasm, tamoxifen treatment, raloxifene treatment, oral contraceptive use, hormone replacement  therapy, and pregnancy. Management of thrombotic risk and thrombotic events should follow established guidelines and fit the clinical circumstance. This result cannot predict the occurrence or recurrence of a thrombotic event. Comment:    Genetic counseling is recommended to discuss the potential clinical implications of positive results, as well as recommendations for testing family members.    Genetic Coordinators are available for health care providers to discuss results at [phone]). Test Details:    Variant Analyzed: c.1601G>A (p. Arg534Gln), referre d to as Factor V Leiden Methods/Limitations:    DNA analysis of the F5 gene (NM_000130.5) was performed by PCR amplification followed by electrophoresis. The  diagnostic sensitivity is >99%. Results must be combined with clinical information for the most accurate interpretation. Molecular-based testing is highly accurate, but as in any laboratory test, diagnostic errors may occur. False positive or false negative results may occur for reasons that include genetic variants, blood transfusions, bone marrow transplantation, somatic or tissue-specific mosaicism, mislabeled samples, or erroneous representation of family relationships.    This test was developed and its performance characteristics determined by [facility]. It has not been cleared or approved by the Food and Drug Administration. References:    Bhatt S, Taylor AK, Lozano R, Grody [facility], Signa [facility]; ACMG Professional Practice and Guidelines Committee. Addendum: [employer] consensus statemen t on factor V Leiden mutation testing. Genet Med. 2021 Mar 5. doi: 89.8961/d58563-978- 01108-x. PMID: 66325232.    Hosey RUSH. Factor V Leiden Thrombophilia. 1999 May 14 (Updated 2018 Jan 4). In: Juliene POSNER, Ardinger HH, Pagon RA, et al., editors. GeneReviews(R) (Internet). [address] ([place]): [place] , [place]; 8006-7978. Available  from: [email or web address]    Laurita GORMAN Waddell BRIDGETT, Huang X, Luo B, Spector EB, Ileana SHAUNNA Gal CS; A[employer] Laboratory Quality Assurance Committee. Venous thromboembolism laboratory testing (factor V Leiden and factor II c. *97G>A), 2018 update: a technical standard of the [employer] and [employer] ([employer]). Genet Med. 2018 Izr;79(87): 8510-8501. doi: 10.1038/s41436-([phone]-z. Epub 2018 Oct 5. PMID: 69702301.    Reviewed By: Comment     Comment: (NOTE) Technical Component performed at [facility] RTP Professional Component performed by: W. Wanda Norse, PhD, [facility], [facility], [address] [place] [address] Performed At: [facility] [address] [place], [place] [id number] Loran Gales MDPhD Ey:[phone]   Prothrombin gene mutation     Status: None   Collection Time: 05/06/24 12:17 PM  Result Value Ref Range   Recommendations-PTGENE: Comment     Comment: (NOTE) Result: c.*97G>A - Not Detected This result is not associated with an increased risk for venous thromboembolism. See Additional Clinical Information and Comments. Additional Clinical Information: Venous thromboembolism is a multifactorial disease influenced by genetic, environmental, and circumstantial risk factors. The c.*97G>A variant in the F2 gene is a genetic risk factor for venous thromboembolism. Heterozygous carriers have a 2- to 4-fold increased risk for venous thromboembolism. Homozygotes for the c.*97G>A variant are rare. The annual risk of VTE in homozygotes has been reported to be 1.1%/year. Individuals who carry both a c.*97G>A variant in the F2 gene and a c.1601G>A (p. Arg534Gln) variant in the F5 gene (commonly referred to as Factor V Leiden) have an approximately 20- fold increased risk for venous thromboembolism. Risks are likely to be even higher in more complex genotype combinations involving the F2 c.*97G>A variant and Factor V Leiden (PMID:   [id number]). Additional risk factors include but are not limited to: deficiency of protein C, protein S, or antithrombin III , age, female sex, personal or family history of deep vein thromboembolism, smoking, surgery, prolonged immobilization, malignant neoplasm, tamoxifen treatment, raloxifene treatment, oral contraceptive use, hormone replacement therapy, and pregnancy. Management of thrombotic risk and thrombotic events should follow established guidelines and fit the clinical circumstance. This result cannot predict the occurrence or recurrence of a thrombotic event. Comments: Genetic counseling is recommended to discuss the potential clinical implications of positive results, as well as recommendations for testing family members. Genetic Coordinators are available for health care providers to discuss results at [phone]). Test Details: Variant analyzed: c.*97G>A, previously referred to as G20210A Methods/Limitations: DNA analysis of the F2 gene (NM_000 506.5)  was performed by PCR amplification followed by restriction enzyme analysis. The diagnostic sensitivity is >99%. Results must be combined with clinical information for the most accurate interpretation. Molecular-based testing is highly accurate, but as in any laboratory test, diagnostic errors may occur. False positive or false negative results may occur for reasons that include genetic variants, blood transfusions, bone marrow transplantation, somatic or tissue-specific mosaicism, mislabeled samples, or erroneous representation of family relationships. This test was developed and its performance characteristics determined by [facility]. It has not been cleared or approved by the Food and Drug Administration. References: Bhatt S, Taylor AK, Lozano R, [facility], [facility]; [employer] Professional Practice and Guidelines Committee. Addendum: [employer] of Medical Genetics consensus statement on factor V Leiden  mutation testing. Genet Med. 2021 Mar 5. doi: 89.8961/d585 36-021-01108-x. PMID: 66325232. Hosey RUSH. Prothrombin Thrombophilia. 2006 Jul 25 [Updated 2021 Feb 4]. In: Juliene POSNER, Ardinger HH, Pagon RA, et al., editors. GeneReviews(R) [Internet]. [address] ([place]): [employer] , [place]; 8006-7978. Available from: [email or web address].com/ Laurita GORMAN Waddell BRIDGETT, Huang X, Luo B, Spector EB, Ileana SHAUNNA Gal CS; [employer] Laboratory Quality Assurance Committee. Venous thromboembolism laboratory testing (factor V Leiden and factor II c.*97G>A), 2018 update: a technical standard of the [employer] of [employer] and [employer] ([facility]). Genet Med. 2018 Dec;20(12):1489-1498. doi: 10.1038/[id number]-z. Epub 2018 Oct 5. PMID: 69702301.    Reviewed by: Comment     Comment: (NOTE) Technical Component performed at [facility] Professional Component performed by: Slater LELON Donate, PhD, [facility] [id number], [facility], [address] [place] [address] Performed At: [facility] [address] [place], [place] [id number] Loran Gales MDPhD Ey:[phone]   PROTEIN S PANEL     Status: Abnormal   Collection Time: 05/06/24 12:17 PM  Result Value Ref Range   Protein S Ag, Total 107 60 - 150 %    Comment: (NOTE) This test was developed and its performance characteristics determined by [facility]. It has not been cleared or approved by the Food and Drug Administration.    Protein S Ag, Free 153 (H) 61 - 136 %   Protein S Activity 109 63 - 140 %    Comment: (NOTE) Protein S activity may be falsely increased (masking an abnormal, low result) in patients receiving direct Xa inhibitor (e.g., rivaroxaban, apixaban , edoxaban) or a direct thrombin inhibitor (e.g., dabigatran) anticoagulant treatment due to assay interference by these drugs. Performed At: [facility] [address] [place], [place] [id number] Jennette Shorter MD Ey:[phone]   Protein C activity     Status:  None   Collection Time: 05/06/24 12:17 PM  Result Value Ref Range   Protein C Activity 155 73 - 180 %    Comment: (NOTE) Performed At: [facility] [address] [place], [place] [id number] Jennette Shorter MD Ey:[phone]   Antithrombin panel     Status: None   Collection Time: 05/06/24 12:17 PM  Result Value Ref Range   Antithrombin Activity 133 75 - 135 %    Comment: (NOTE) Direct Xa inhibitor anticoagulants such as rivaroxaban, apixaban  and edoxaban will lead to spuriously elevated antithrombin activity levels possibly masking a deficiency.    AT III AG PPP IMM-ACNC 74 72 - 124 %    Comment: (NOTE) This test was developed and its performance characteristics determined by [facility]. It has not been cleared or approved by the Food and Drug Administration. Performed At: [facility] [address] [place], [place] [id number] Jennette Shorter MD Ey:[phone]   PTT-LA Mix  Status: Abnormal   Collection Time: 05/06/24 12:17 PM  Result Value Ref Range   PTT-LA Mix 48.6 (H) 0.0 - 40.5 sec    Comment: (NOTE) Performed At: [facility] [address] [place], [place] [id number] Jennette Shorter MD Ey:[phone]   Hexagonal Phase Phospholipid     Status: None   Collection Time: 05/06/24 12:17 PM  Result Value Ref Range   Hexagonal Phase Phospholipid 5 0 - 11 sec    Comment: (NOTE) Performed At: [facility] [address] [place], [place] [id number] Jennette Shorter MD Ey:[phone]      RADIOGRAPHIC STUDIES:  I have personally reviewed the radiological images as listed and agree with the findings in the report.  VAS US  LOWER EXTREMITY VENOUS (DVT)  Lower Venous DVT Study  Patient Name:  SHEYANN SULTON  Date of Exam:   02/23/2024 Medical Rec #: [id number]         Accession #:    [id number] Date of Birth: 1996/02/17          Patient Gender: F Patient Age:   28 years Exam Location:  [facility] Procedure:      VAS US  LOWER EXTREMITY  VENOUS (DVT) Referring Phys: DELON HERALD  --------------------------------------------------------------------------------   Indications: Pulmonary embolism.   Risk Factors: History of atrial fibrillation, status post ablation 2023. Comparison Study: Prior left LEV done at [facility] 10/08/22  Performing Technologist: Alberta Lis RVS    Examination Guidelines: A complete evaluation includes B-mode imaging, spectral Doppler, color Doppler, and power Doppler as needed of all accessible portions of each vessel. Bilateral testing is considered an integral part of a complete examination. Limited examinations for reoccurring indications may be performed as noted. The reflux portion of the exam is performed with the patient in reverse Trendelenburg.     +---------+---------------+---------+-----------+----------+--------------+ RIGHT    CompressibilityPhasicitySpontaneityPropertiesThrombus Aging +---------+---------------+---------+-----------+----------+--------------+ CFV      Full           Yes      Yes                                 +---------+---------------+---------+-----------+----------+--------------+ SFJ      Full                                                        +---------+---------------+---------+-----------+----------+--------------+ FV Prox  Full                                                        +---------+---------------+---------+-----------+----------+--------------+ FV Mid   Full                                                        +---------+---------------+---------+-----------+----------+--------------+ FV DistalFull                                                        +---------+---------------+---------+-----------+----------+--------------+  PFV      Full                                                        +---------+---------------+---------+-----------+----------+--------------+ POP       Full           Yes      Yes                                 +---------+---------------+---------+-----------+----------+--------------+ PTV      Full                                                        +---------+---------------+---------+-----------+----------+--------------+ PERO     Full                                                        +---------+---------------+---------+-----------+----------+--------------+        +---------+---------------+---------+-----------+----------+--------------+ LEFT     CompressibilityPhasicitySpontaneityPropertiesThrombus Aging +---------+---------------+---------+-----------+----------+--------------+ CFV      Full           Yes      Yes                                 +---------+---------------+---------+-----------+----------+--------------+ SFJ      Full                                                        +---------+---------------+---------+-----------+----------+--------------+ FV Prox  Full                                                        +---------+---------------+---------+-----------+----------+--------------+ FV Mid   Full                                                        +---------+---------------+---------+-----------+----------+--------------+ FV DistalFull                                                        +---------+---------------+---------+-----------+----------+--------------+ PFV      Full                                                        +---------+---------------+---------+-----------+----------+--------------+  POP      Full           Yes      No                                  +---------+---------------+---------+-----------+----------+--------------+ PTV      Full                                                        +---------+---------------+---------+-----------+----------+--------------+ PERO     Full                                                         +---------+---------------+---------+-----------+----------+--------------+             Summary: BILATERAL: - No evidence of deep vein thrombosis seen in the lower extremities, bilaterally. -No evidence of popliteal cyst, bilaterally.       *See table(s) above for measurements and observations.  Electronically signed by Debby Robertson on 02/24/2024 at 11:28:57 AM.      Final    Orders Placed This Encounter  Procedures   CBC with Differential (Cancer Center Only)    Standing Status:   Future    Expected Date:   08/21/2024    Expiration Date:   11/19/2024   D-dimer, quantitative    Standing Status:   Future    Expected Date:   08/21/2024    Expiration Date:   11/19/2024     Future Appointments  Date Time Provider Department Center  08/14/2024  8:30 AM [facility] 2 [facility] [place]  08/21/2024  9:00 AM [facility] None  08/21/2024  9:30 AM Othel Hoogendoorn, [place], MD [facility] None    This document was completed utilizing speech recognition software. Grammatical errors, random word insertions, pronoun errors, and incomplete sentences are an occasional consequence of this system due to software limitations, ambient noise, and hardware issues. Any formal questions or concerns about the content, text or information contained within the body of this dictation should be directly addressed to the provider for clarification.  "

## 2024-05-28 ENCOUNTER — Other Ambulatory Visit (HOSPITAL_COMMUNITY): Payer: Self-pay

## 2024-05-28 ENCOUNTER — Other Ambulatory Visit: Payer: Self-pay

## 2024-06-18 ENCOUNTER — Encounter: Payer: Self-pay | Admitting: Oncology

## 2024-08-14 ENCOUNTER — Ambulatory Visit (HOSPITAL_COMMUNITY)

## 2024-08-21 ENCOUNTER — Inpatient Hospital Stay: Admitting: Oncology

## 2024-08-21 ENCOUNTER — Inpatient Hospital Stay
